# Patient Record
Sex: Male | Born: 1935 | Race: Black or African American | Hispanic: No | Marital: Married | Smoking: Former smoker
Health system: Southern US, Community
[De-identification: ages and names within clinical notes are randomized; demographics above are authoritative.]

## PROBLEM LIST (undated history)

## (undated) DIAGNOSIS — D696 Thrombocytopenia, unspecified: Secondary | ICD-10-CM

## (undated) DIAGNOSIS — E785 Hyperlipidemia, unspecified: Secondary | ICD-10-CM

## (undated) DIAGNOSIS — K279 Peptic ulcer, site unspecified, unspecified as acute or chronic, without hemorrhage or perforation: Secondary | ICD-10-CM

## (undated) DIAGNOSIS — K52832 Lymphocytic colitis: Secondary | ICD-10-CM

## (undated) DIAGNOSIS — I10 Essential (primary) hypertension: Secondary | ICD-10-CM

## (undated) DIAGNOSIS — K2960 Other gastritis without bleeding: Secondary | ICD-10-CM

## (undated) DIAGNOSIS — K219 Gastro-esophageal reflux disease without esophagitis: Secondary | ICD-10-CM

## (undated) DIAGNOSIS — S73004A Unspecified dislocation of right hip, initial encounter: Secondary | ICD-10-CM

## (undated) DIAGNOSIS — I219 Acute myocardial infarction, unspecified: Secondary | ICD-10-CM

## (undated) DIAGNOSIS — I2510 Atherosclerotic heart disease of native coronary artery without angina pectoris: Secondary | ICD-10-CM

## (undated) DIAGNOSIS — A0472 Enterocolitis due to Clostridium difficile, not specified as recurrent: Secondary | ICD-10-CM

## (undated) HISTORY — PX: TOTAL HIP ARTHROPLASTY: SHX124

## (undated) HISTORY — DX: Peptic ulcer, site unspecified, unspecified as acute or chronic, without hemorrhage or perforation: K27.9

## (undated) HISTORY — DX: Acute myocardial infarction, unspecified: I21.9

## (undated) HISTORY — PX: APPENDECTOMY: SHX54

## (undated) HISTORY — PX: TONSILLECTOMY: SUR1361

## (undated) HISTORY — PX: INGUINAL HERNIA REPAIR: SUR1180

## (undated) HISTORY — PX: FEMORAL HERNIA REPAIR: SUR1179

## (undated) HISTORY — DX: Unspecified dislocation of right hip, initial encounter: S73.004A

## (undated) HISTORY — DX: Lymphocytic colitis: K52.832

## (undated) HISTORY — PX: UPPER GASTROINTESTINAL ENDOSCOPY: SHX188

## (undated) HISTORY — PX: ARTHROPLASTY: SHX135

## (undated) HISTORY — DX: Enterocolitis due to Clostridium difficile, not specified as recurrent: A04.72

## (undated) HISTORY — DX: Other gastritis without bleeding: K29.60

## (undated) HISTORY — PX: COLONOSCOPY: SHX174

## (undated) HISTORY — PX: HEMORRHOID SURGERY: SHX153

## (undated) HISTORY — DX: Thrombocytopenia, unspecified: D69.6

## (undated) HISTORY — PX: CORONARY ANGIOPLASTY WITH STENT PLACEMENT: SHX49

---

## 2001-02-02 HISTORY — PX: PACEMAKER INSERTION: SHX728

## 2006-02-02 DIAGNOSIS — I219 Acute myocardial infarction, unspecified: Secondary | ICD-10-CM

## 2006-02-02 HISTORY — DX: Acute myocardial infarction, unspecified: I21.9

## 2008-09-17 ENCOUNTER — Emergency Department (HOSPITAL_BASED_OUTPATIENT_CLINIC_OR_DEPARTMENT_OTHER): Admission: EM | Admit: 2008-09-17 | Discharge: 2008-09-17 | Payer: Self-pay | Admitting: Emergency Medicine

## 2008-09-17 ENCOUNTER — Ambulatory Visit: Payer: Self-pay | Admitting: Diagnostic Radiology

## 2008-10-09 ENCOUNTER — Emergency Department (HOSPITAL_BASED_OUTPATIENT_CLINIC_OR_DEPARTMENT_OTHER): Admission: EM | Admit: 2008-10-09 | Discharge: 2008-10-09 | Payer: Self-pay | Admitting: Emergency Medicine

## 2009-01-02 ENCOUNTER — Ambulatory Visit: Payer: Self-pay | Admitting: Internal Medicine

## 2009-01-02 ENCOUNTER — Encounter (INDEPENDENT_AMBULATORY_CARE_PROVIDER_SITE_OTHER): Payer: Self-pay | Admitting: *Deleted

## 2009-01-03 ENCOUNTER — Encounter (INDEPENDENT_AMBULATORY_CARE_PROVIDER_SITE_OTHER): Payer: Self-pay | Admitting: *Deleted

## 2009-01-03 ENCOUNTER — Encounter: Payer: Self-pay | Admitting: Internal Medicine

## 2009-01-16 ENCOUNTER — Encounter (INDEPENDENT_AMBULATORY_CARE_PROVIDER_SITE_OTHER): Payer: Self-pay | Admitting: *Deleted

## 2009-01-24 ENCOUNTER — Telehealth: Payer: Self-pay | Admitting: Internal Medicine

## 2009-02-02 DIAGNOSIS — A0472 Enterocolitis due to Clostridium difficile, not specified as recurrent: Secondary | ICD-10-CM

## 2009-02-02 HISTORY — DX: Enterocolitis due to Clostridium difficile, not specified as recurrent: A04.72

## 2009-02-02 HISTORY — PX: TOTAL HIP REVISION: SHX763

## 2009-02-16 ENCOUNTER — Emergency Department (HOSPITAL_COMMUNITY): Admission: EM | Admit: 2009-02-16 | Discharge: 2009-02-16 | Payer: Self-pay | Admitting: Emergency Medicine

## 2009-02-18 ENCOUNTER — Encounter (INDEPENDENT_AMBULATORY_CARE_PROVIDER_SITE_OTHER): Payer: Self-pay | Admitting: *Deleted

## 2009-04-29 ENCOUNTER — Ambulatory Visit: Payer: Self-pay | Admitting: Diagnostic Radiology

## 2009-04-29 ENCOUNTER — Emergency Department (HOSPITAL_BASED_OUTPATIENT_CLINIC_OR_DEPARTMENT_OTHER): Admission: EM | Admit: 2009-04-29 | Discharge: 2009-04-29 | Payer: Self-pay | Admitting: Emergency Medicine

## 2009-05-09 ENCOUNTER — Telehealth: Payer: Self-pay | Admitting: Internal Medicine

## 2009-05-13 ENCOUNTER — Inpatient Hospital Stay (HOSPITAL_COMMUNITY): Admission: AD | Admit: 2009-05-13 | Discharge: 2009-05-16 | Payer: Self-pay | Admitting: Internal Medicine

## 2009-05-13 ENCOUNTER — Ambulatory Visit: Payer: Self-pay | Admitting: Gastroenterology

## 2009-05-13 DIAGNOSIS — A0472 Enterocolitis due to Clostridium difficile, not specified as recurrent: Secondary | ICD-10-CM | POA: Insufficient documentation

## 2009-05-13 DIAGNOSIS — I2510 Atherosclerotic heart disease of native coronary artery without angina pectoris: Secondary | ICD-10-CM | POA: Insufficient documentation

## 2009-05-13 DIAGNOSIS — E785 Hyperlipidemia, unspecified: Secondary | ICD-10-CM | POA: Insufficient documentation

## 2009-05-13 DIAGNOSIS — M1990 Unspecified osteoarthritis, unspecified site: Secondary | ICD-10-CM | POA: Insufficient documentation

## 2009-05-13 DIAGNOSIS — I10 Essential (primary) hypertension: Secondary | ICD-10-CM | POA: Insufficient documentation

## 2009-05-21 ENCOUNTER — Telehealth: Payer: Self-pay | Admitting: Internal Medicine

## 2009-05-21 ENCOUNTER — Ambulatory Visit: Payer: Self-pay | Admitting: Internal Medicine

## 2009-05-23 ENCOUNTER — Telehealth (INDEPENDENT_AMBULATORY_CARE_PROVIDER_SITE_OTHER): Payer: Self-pay

## 2009-05-27 ENCOUNTER — Encounter (INDEPENDENT_AMBULATORY_CARE_PROVIDER_SITE_OTHER): Payer: Self-pay

## 2009-05-28 ENCOUNTER — Ambulatory Visit: Payer: Self-pay | Admitting: Internal Medicine

## 2009-06-04 ENCOUNTER — Telehealth: Payer: Self-pay | Admitting: Internal Medicine

## 2009-06-04 ENCOUNTER — Inpatient Hospital Stay (HOSPITAL_COMMUNITY): Admission: AD | Admit: 2009-06-04 | Discharge: 2009-06-06 | Payer: Self-pay | Admitting: Internal Medicine

## 2009-06-04 ENCOUNTER — Ambulatory Visit: Payer: Self-pay | Admitting: Internal Medicine

## 2009-06-10 ENCOUNTER — Ambulatory Visit: Payer: Self-pay | Admitting: Gastroenterology

## 2009-06-10 ENCOUNTER — Telehealth: Payer: Self-pay | Admitting: Internal Medicine

## 2009-07-02 ENCOUNTER — Ambulatory Visit: Payer: Self-pay | Admitting: Internal Medicine

## 2010-02-15 ENCOUNTER — Emergency Department (HOSPITAL_BASED_OUTPATIENT_CLINIC_OR_DEPARTMENT_OTHER)
Admission: EM | Admit: 2010-02-15 | Discharge: 2010-02-15 | Payer: Self-pay | Source: Home / Self Care | Admitting: Emergency Medicine

## 2010-02-15 ENCOUNTER — Encounter (INDEPENDENT_AMBULATORY_CARE_PROVIDER_SITE_OTHER): Payer: Self-pay | Admitting: *Deleted

## 2010-02-17 ENCOUNTER — Telehealth: Payer: Self-pay | Admitting: Internal Medicine

## 2010-02-17 LAB — PROTIME-INR
INR: 1.03 (ref 0.00–1.49)
Prothrombin Time: 13.7 seconds (ref 11.6–15.2)

## 2010-02-17 LAB — COMPREHENSIVE METABOLIC PANEL
ALT: 56 U/L — ABNORMAL HIGH (ref 0–53)
AST: 54 U/L — ABNORMAL HIGH (ref 0–37)
Albumin: 4 g/dL (ref 3.5–5.2)
Alkaline Phosphatase: 112 U/L (ref 39–117)
BUN: 17 mg/dL (ref 6–23)
CO2: 24 mEq/L (ref 19–32)
Calcium: 8.9 mg/dL (ref 8.4–10.5)
Chloride: 102 mEq/L (ref 96–112)
Creatinine, Ser: 0.9 mg/dL (ref 0.4–1.5)
GFR calc Af Amer: >60 mL/min (ref >60)
GFR calc non Af Amer: >60 mL/min (ref >60)
Glucose, Bld: 91 mg/dL (ref 70–99)
Potassium: 3.6 mEq/L (ref 3.5–5.1)
Sodium: 140 mEq/L (ref 135–145)
Total Bilirubin: 0.9 mg/dL (ref 0.3–1.2)
Total Protein: 7.3 g/dL (ref 6.0–8.3)

## 2010-02-17 LAB — URINALYSIS, ROUTINE W REFLEX MICROSCOPIC
Hgb urine dipstick: NEGATIVE
Ketones, ur: 40 mg/dL — AB
Leukocytes, UA: NEGATIVE
Nitrite: NEGATIVE
Protein, ur: 30 mg/dL — AB
Specific Gravity, Urine: 1.036 — ABNORMAL HIGH (ref 1.005–1.030)
Urine Glucose, Fasting: NEGATIVE mg/dL
Urobilinogen, UA: 0.2 mg/dL (ref 0.0–1.0)
pH: 6 (ref 5.0–8.0)

## 2010-02-17 LAB — URINE MICROSCOPIC-ADD ON

## 2010-02-17 LAB — DIFFERENTIAL
Basophils Absolute: 0 10*3/uL (ref 0.0–0.1)
Basophils Relative: 0 % (ref 0–1)
Eosinophils Absolute: 0.1 10*3/uL (ref 0.0–0.7)
Eosinophils Relative: 1 % (ref 0–5)
Lymphocytes Relative: 11 % — ABNORMAL LOW (ref 12–46)
Lymphs Abs: 1 10*3/uL (ref 0.7–4.0)
Monocytes Absolute: 0.7 10*3/uL (ref 0.1–1.0)
Monocytes Relative: 7 % (ref 3–12)
Neutro Abs: 7.8 10*3/uL — ABNORMAL HIGH (ref 1.7–7.7)
Neutrophils Relative %: 81 % — ABNORMAL HIGH (ref 43–77)

## 2010-02-17 LAB — CLOSTRIDIUM DIFFICILE BY PCR: Toxigenic C. Difficile by PCR: NEGATIVE

## 2010-02-17 LAB — CBC
HCT: 40.2 % (ref 39.0–52.0)
Hemoglobin: 13.8 g/dL (ref 13.0–17.0)
MCH: 27.8 pg (ref 26.0–34.0)
MCHC: 34.3 g/dL (ref 30.0–36.0)
MCV: 80.9 fL (ref 78.0–100.0)
Platelets: 177 10*3/uL (ref 150–400)
RBC: 4.97 MIL/uL (ref 4.22–5.81)
RDW: 15.4 % (ref 11.5–15.5)
WBC: 9.6 10*3/uL (ref 4.0–10.5)

## 2010-02-17 LAB — LIPASE, BLOOD: Lipase: 104 U/L (ref 23–300)

## 2010-02-17 LAB — LACTIC ACID, PLASMA: Lactic Acid, Venous: 1.2 mmol/L (ref 0.5–2.2)

## 2010-02-19 LAB — STOOL CULTURE

## 2010-02-19 LAB — OVA AND PARASITE EXAMINATION

## 2010-02-21 ENCOUNTER — Encounter: Payer: Self-pay | Admitting: Internal Medicine

## 2010-02-21 ENCOUNTER — Ambulatory Visit
Admission: RE | Admit: 2010-02-21 | Discharge: 2010-02-21 | Payer: Self-pay | Source: Home / Self Care | Attending: Internal Medicine | Admitting: Internal Medicine

## 2010-02-24 ENCOUNTER — Other Ambulatory Visit: Payer: Self-pay | Admitting: Internal Medicine

## 2010-02-24 ENCOUNTER — Ambulatory Visit
Admission: RE | Admit: 2010-02-24 | Discharge: 2010-02-24 | Payer: Self-pay | Source: Home / Self Care | Attending: Internal Medicine | Admitting: Internal Medicine

## 2010-02-27 ENCOUNTER — Telehealth: Payer: Self-pay | Admitting: Internal Medicine

## 2010-02-28 ENCOUNTER — Other Ambulatory Visit: Payer: Self-pay | Admitting: Internal Medicine

## 2010-02-28 ENCOUNTER — Ambulatory Visit
Admission: RE | Admit: 2010-02-28 | Discharge: 2010-02-28 | Payer: Self-pay | Source: Home / Self Care | Attending: Internal Medicine | Admitting: Internal Medicine

## 2010-02-28 ENCOUNTER — Encounter: Payer: Self-pay | Admitting: Internal Medicine

## 2010-02-28 DIAGNOSIS — K2940 Chronic atrophic gastritis without bleeding: Secondary | ICD-10-CM

## 2010-03-04 ENCOUNTER — Telehealth: Payer: Self-pay | Admitting: Internal Medicine

## 2010-03-04 NOTE — Progress Notes (Signed)
Summary: TRIAGE:Diarrhea  Phone Note Call from Patient Call back at Home Phone [phone]   Call For: Dr Leone Payor Summary of Call: Diarrhea came back this morning. Unable to even keep water down. Initial call taken by: Leanor Kail [facility],  Jun 04, 2009 11:06 AM  Follow-up for Phone Call        Patient  reports diarrhea returned yesterday and he also started vomiting yesterday, unable to hold anything down.  Tried water this am and not able to tolerate.  He is scheduled for a colon for Dr Leone Payor on Thursday.  Hospitalized last month for c-diff and vomiting.  Patient  will come in today and see Mike Gip PA at  2:30 Iva Boop MD, [facility]  Jun 04, 2009 11:37 AM  Follow-up by: Darcey Nora RN, CGRN,  Jun 04, 2009 11:24 AM

## 2010-03-04 NOTE — Progress Notes (Signed)
Summary: Symptom update  Phone Note Outgoing Call Call back at [facility] Phone [phone]   Call placed by: Darcey Nora RN, CGRN,  May 23, 2009 11:16 AM Call placed to: Patient Summary of Call: Pt called back with an update on his symptoms.  Stool is now getting semisolid.  Only one stool today, yesterday 3 watery stools.    Patient  is c/o nausea.  He is staying on a bland diet.  Denies fever.   Initial call taken by: Darcey Nora RN, CGRN,  May 23, 2009 11:19 AM  Follow-up for Phone Call        nausea likely from [facility] sounds like he is improving so continue with same plan he should be set up for colonoscopy in May - needs to hold Plavix - see prior documentation Follow-up by: Iva Boop MD, [facility],  May 23, 2009 1:56 PM  Additional Follow-up for Phone Call Additional follow up Details #1::        Left message for patient to call back Darcey Nora RN, [facility]  May 23, 2009 2:11 PM    Additional Follow-up for Phone Call Additional follow up Details #2::    Colon scheduled for 06/06/09 11:30, pre-visit 05/28/09 4:00 Darcey Nora RN, [facility]  May 27, 2009 8:33 AM

## 2010-03-04 NOTE — Assessment & Plan Note (Signed)
Summary: hosp. f/u per paula--ch.   History of Present Illness Visit Type: Follow-up Visit Primary GI MD: Stan Head MD [facility] Primary Provider: Tracey Harries, MD Requesting Provider: na Chief Complaint: Bowel movements immediately after meals History of Present Illness:   VERY NICE  75 YO MALE KNOWN TO DR. Leone Payor WHO HAS BEEN UNDERGOING TREATMENT FOR  C.DIFF. HE WAS HOSPITALIZED BRIEFLY SEVERAL WEEKS AGO,WAS TREATED WITH A 2 WEEK COURSE OF FLAGYL AND FLORASTOR WITH RESOLUTION OF HIS SXS. UNFORTUNATELY HE RELAPSED. HE WAS HOSPITALIZED AGAIN 5/3-5/5,BUT WAS SENT HOME ON FLAGYL AGAIN. HE DID NOT RESPOND AND WAS SEEN 5/9 AND STARTED ON VANCOMYCIN AND FLORASTOR. HE IS TAKING A TAPERED COURSE OVER 4 WEEKS.   HE IS FEELING GOOD, HAS GAINED 8 POUNDS. HE DENIES ANY ABDOMINAL PAIN, CRAMPING. STOOLS ARE FORMED,HAVING 4-5 BM'S /DAY.HE WILL START HIS LAST WEEK OF VANCO AT 250MG  ONCE DAILY HIS WEEK.   GI Review of Systems      Denies abdominal pain, acid reflux, belching, bloating, chest pain, dysphagia with liquids, dysphagia with solids, heartburn, loss of appetite, nausea, vomiting, vomiting blood, weight loss, and  weight gain.        Denies anal fissure, black tarry stools, change in bowel habit, constipation, diarrhea, diverticulosis, fecal incontinence, heme positive stool, hemorrhoids, irritable bowel syndrome, jaundice, light color stool, liver problems, rectal bleeding, and  rectal pain.    Current Medications (verified): 1)  Plavix 75 Mg Tabs (Clopidogrel Bisulfate) .... Take 1 Tablet By Mouth Once A Day (Holding) 2)  Lipitor 40 Mg Tabs (Atorvastatin Calcium) .... Take 1 Tablet By Mouth Once A Day 3)  Lisinopril 2.5 Mg Tabs (Lisinopril) .... Take 1 Tablet By Mouth Once A Day 4)  Metoprolol Tartrate 50 Mg Tabs (Metoprolol Tartrate) .... Take 1 Tablet By Mouth Two Times A Day 5)  Prevacid 30 Mg Cpdr (Lansoprazole) .... Take 1 Capsule By Mouth As Needed 6)  Vitamin B-12 100 Mcg Tabs  (Cyanocobalamin) .... Once Daily 7)  Aspirin 81 Mg Tbec (Aspirin) .... Take 1 Tablet By Mouth Once A Day 8)  Multivitamins  Tabs (Multiple Vitamin) .... Take 1 Tablet By Mouth Once A Day 9)  Afrin Nasal Spray 0.05 % Soln (Oxymetazoline Hcl) .... Once Daily 10)  Aleve 220 Mg Caps (Naproxen Sodium) .... Take One By Mouth As Needed 11)  Vancomycin 250 Mg/ml .... Take 1 Tsp 3 Times Daily On 5-9 Take 1 Tsp 4 Times Daily X 13 Days, Take 1 Tsp 2 Times Daily X 7 Days Take 1 Tsp 1 Time Daily X 7 Days  Allergies (verified): 1)  ! Allyson Sabal Flavors 2)  ! Niacin  Past History:  Past Medical History: Reviewed history from 06/10/2009 and no changes required. Coronary Artery Disease PTCA and DES x 2 (2008, 2009),s/p pacemaker 2003 GERD C.DIFF COLITIS 4/11 /RELAPSED 5/11. Hyperlipidemia Hypertension Myocardial Infarction Peptic Ulcer Disease-remote12-15 years ago Dislocated Rt. Hip  Past Surgical History: Reviewed history from 05/13/2009 and no changes required. Hernia Surgery-bilateral inguinal Hip Replacement-Bilateral PTCA-Stent Tonsillectomy pacemaker placed 2003  Family History: Reviewed history from 05/13/2009 and no changes required. Family History of Heart Disease: Mother, Father, Sisters, Brothers Family History of Diabetes: Sister and brother No FH of Colon Cancer: Family History of Breast Cancer:sister  Social History: Reviewed history from 05/13/2009 and no changes required. Married,wife with dementia Retired Patient is a former smoker. Quit in 1970. Alcohol Use - no Illicit Drug Use - no Patient gets regular exercise. Walking 3 x week  Review of  Systems       The patient complains of anxiety-new, arthritis/joint pain, and skin rash.  The patient denies allergy/sinus, anemia, back pain, blood in urine, breast changes/lumps, change in vision, confusion, cough, coughing up blood, depression-new, fainting, fatigue, fever, headaches-new, hearing problems, heart murmur, heart  rhythm changes, itching, menstrual pain, muscle pains/cramps, night sweats, nosebleeds, pregnancy symptoms, shortness of breath, sleeping problems, sore throat, swelling of feet/legs, swollen lymph glands, thirst - excessive , urination - excessive , urination changes/pain, urine leakage, vision changes, and voice change.         ROS OTHERWISE AS IN HPI  Vital Signs:  Patient profile:   75 year old male Height:      68 inches Weight:      180.50 pounds BMI:     27.54 Pulse rate:   68 / minute Pulse rhythm:   regular BP sitting:   112 / 74  (left arm) Cuff size:   regular  Vitals Entered By: June McMurray CMA Duncan Dull) (Jul 02, 2009 8:55 AM)  Physical Exam  General:  Well developed, well nourished, no acute distress. Head:  Normocephalic and atraumatic. Eyes:  PERRLA, no icterus. Lungs:  Clear throughout to auscultation. Heart:  Regular rate and rhythm; no murmurs, rubs,  or bruits. Abdomen:  SOFT, NONTENDER, NO MASS OR HSM,BS+ Rectal:  NOT DONE Extremities:  No clubbing, cyanosis, edema or deformities noted. Neurologic:  Alert and  oriented x4;  grossly normal neurologically. Psych:  Alert and cooperative. Normal mood and affect.   Impression & Recommendations:  Problem # 1:  CLOSTRIDIUM DIFFICILE COLITIS (ICD-008.45) 75 YO MALE WITH RELAPED C.DIFF COLITIS-IMPROVED ON VANCOMYCIN-CURRENTLY FINISHING A 4 WEEK  TAPERED COURSE.   FINISH 4TH WEEK OF VANCOMYCIN 250MG  DAILY TAKE 2 MORE WEEKS OF FLORASTOR TWICE DAILY  ROV WITH DR. Leone Payor AS NEEDED. PT ADVISED TO CALL FOR ANY RECURRENCE OF SXS.  Patient Instructions: 1)  Take 1 tab of Florastor twice daily until finished. 2)  Samples given. 3)  Finish the Vancomycin.  4)  Copy sent to : Dr. Everlene Other, MD 5)  The medication list was reviewed and reconciled.  All changed / newly prescribed medications were explained.  A complete medication list was provided to the patient / caregiver.

## 2010-03-04 NOTE — Assessment & Plan Note (Signed)
Summary: DIARRHEA/SP   History of Present Illness Visit Type: Follow-up Visit Primary GI MD: Stan Head MD [facility] Primary Provider: Tracey Harries, MD Requesting Provider: Tracey Harries, MD Chief Complaint: diarrhea x 5 weeks History of Present Illness:   F/U of C. diff diarrhea, was admitted last week and diagnosed by stool study, treated with cholestyramine and metronidazole.Keeping liquids down but runny diarrhea remains a problem. No significant abdomoinal pain. Some difficulty swallowing large pills Grits going down ok He has had 3 watery stools and dark green in last 24 hours. Less frequent than when hospitalized. 90% better Thinks cholestyramine makes him defecate.            Current Medications (verified): 1)  Plavix 75 Mg Tabs (Clopidogrel Bisulfate) .... Take 1 Tablet By Mouth Once A Day 2)  Lipitor 40 Mg Tabs (Atorvastatin Calcium) .... Take 1 Tablet By Mouth Once A Day 3)  Lisinopril 2.5 Mg Tabs (Lisinopril) .... Take 1 Tablet By Mouth Once A Day 4)  Metoprolol Tartrate 50 Mg Tabs (Metoprolol Tartrate) .... Take 1 Tablet By Mouth Two Times A Day 5)  Citracal/vitamin D 250-200 Mg-Unit Tabs (Calcium Citrate-Vitamin D) .... Once Daily 6)  Prevacid 30 Mg Cpdr (Lansoprazole) .... Take 1 Capsule By Mouth As Needed 7)  Vitamin B-12 100 Mcg Tabs (Cyanocobalamin) .... Once Daily 8)  Aspirin 81 Mg Tbec (Aspirin) .... Take 1 Tablet By Mouth Once A Day 9)  Multivitamins  Tabs (Multiple Vitamin) .... Take 1 Tablet By Mouth Once A Day 10)  Afrin Nasal Spray 0.05 % Soln (Oxymetazoline Hcl) .... Once Daily 11)  Tylenol Extra Strength 500 Mg Tabs (Acetaminophen) .... Two Tablets By Mouth Two Times A Day 12)  Metronidazole 500 Mg  Tabs (Metronidazole) .... Take 1 By Mouth Three Times A Day 13)  Cholestyramine   Powd (Cholestyramine) .Marland Kitchen.. 1 Packet Three Times A Day 14)  Florastor 250 Mg Caps (Saccharomyces Boulardii) .Marland Kitchen.. 1 By Mouth Two Times A Day  Allergies (verified): 1)  ! Allyson Sabal  Flavors 2)  ! Niacin  Past History:  Past Medical History: Reviewed history from 05/13/2009 and no changes required. Coronary Artery Disease PTCA and DES x 2 (2008, 2009),s/p pacemaker 2003 GERD Hyperlipidemia Hypertension Myocardial Infarction Peptic Ulcer Disease-remote12-15 years ago Dislocated Rt. Hip  Past Surgical History: Reviewed history from 05/13/2009 and no changes required. Hernia Surgery-bilateral inguinal Hip Replacement-Bilateral PTCA-Stent Tonsillectomy pacemaker placed 2003  Family History: Reviewed history from 05/13/2009 and no changes required. Family History of Heart Disease: Mother, Father, Sisters, Brothers Family History of Diabetes: Sister and brother No FH of Colon Cancer: Family History of Breast Cancer:sister  Social History: Reviewed history from 05/13/2009 and no changes required. Married,wife with dementia Retired Patient is a former smoker. Quit in 1970. Alcohol Use - no Illicit Drug Use - no Patient gets regular exercise. Walking 3 x week  Review of Systems       The patient complains of arthritis/joint pain, fatigue, heart rhythm changes, night sweats, shortness of breath, and thirst - excessive.         All other ROS negative except as per HPI.   Vital Signs:  Patient profile:   75 year old male Height:      68 inches Weight:      180.13 pounds BMI:     27.49 Pulse rate:   80 / minute Pulse rhythm:   regular BP sitting:   90 / 56  (right arm) Cuff size:   regular  Vitals  Entered By: June McMurray CMA Duncan Dull) (May 21, 2009 3:39 PM)  Physical Exam  General:  Well developed, well nourished, no acute distress. Lungs:  Clear throughout to auscultation. Heart:  Regular rate and rhythm; no murmurs, rubs,  or bruits. Abdomen:  soft, bs+, nontender, no mass or hsm   Impression & Recommendations:  Problem # 1:  CLOSTRIDIUM DIFFICILE COLITIS (ICD-008.45) Assessment Improved he is better but still symptmatic it is  possible cholestyramine is binding meds will stop that and recheck in 2 days by phone may need longer metronidazole vs. vancomycin force fluids  Patient Instructions: 1)  Please stop the cholestyramine. 2)  Please continue to drink as many fluids as you can and eat food as tolerated. 3)  We will call you for a follow up on Thursday.  If you have not heard from [place] by 11am, then call our office. 4)  Copy sent to : Tracey Harries, MD 5)  The medication list was reviewed and reconciled.  All changed / newly prescribed medications were explained.  A complete medication list was provided to the patient / caregiver.

## 2010-03-04 NOTE — Letter (Signed)
Summary: [facility] Instructions  [facility]  [address] [place], [place] [address]   Phone: [phone]  Fax: [phone]       Jose Ibarra    06-10-1969    MRN: [id number]        Procedure Day /Date: Thursday 06/06/2009     Arrival Time: 10:30 am     Procedure Time: 11:30 am     Location of Procedure:                    _ x_   Endoscopy Center (4th Floor)                        PREPARATION FOR COLONOSCOPY WITH MOVIPREP   Starting 5 days prior to your procedure Saturday 4/30 do not eat nuts, seeds, popcorn, corn, beans, peas,  salads, or any raw vegetables.  Do not take any fiber supplements (e.g. Metamucil, Citrucel, and Benefiber).  THE DAY BEFORE YOUR PROCEDURE         DATE: Wednesday 5/4  1.  Drink clear liquids the entire day-NO SOLID FOOD  2.  Do not drink anything colored red or purple.  Avoid juices with pulp.  No orange juice.  3.  Drink at least 64 oz. (8 glasses) of fluid/clear liquids during the day to prevent dehydration and help the prep work efficiently.  CLEAR LIQUIDS INCLUDE: Water Jello Ice Popsicles Tea (sugar ok, no milk/cream) Powdered fruit flavored drinks Coffee (sugar ok, no milk/cream) Gatorade Juice: apple, white grape, white cranberry  Lemonade Clear bullion, consomm, broth Carbonated beverages (any kind) Strained chicken noodle soup Hard Candy                             4.  In the morning, mix first dose of MoviPrep solution:    Empty 1 Pouch A and 1 Pouch B into the disposable container    Add lukewarm drinking water to the top line of the container. Mix to dissolve    Refrigerate (mixed solution should be used within 24 hrs)  5.  Begin drinking the prep at 5:00 p.m. The MoviPrep container is divided by 4 marks.   Every 15 minutes drink the solution down to the next mark (approximately 8 oz) until the full liter is complete.   6.  Follow completed prep with 16 oz of clear liquid of your choice (Nothing  red or purple).  Continue to drink clear liquids until bedtime.  7.  Before going to bed, mix second dose of MoviPrep solution:    Empty 1 Pouch A and 1 Pouch B into the disposable container    Add lukewarm drinking water to the top line of the container. Mix to dissolve    Refrigerate  THE DAY OF YOUR PROCEDURE      DATE: Thursday 5/5  Beginning at 6:30 a.m. (5 hours before procedure):         1. Every 15 minutes, drink the solution down to the next mark (approx 8 oz) until the full liter is complete.  2. Follow completed prep with 16 oz. of clear liquid of your choice.    3. You may drink clear liquids until 9:30 am (2 HOURS BEFORE PROCEDURE).   MEDICATION INSTRUCTIONS  Unless otherwise instructed, you should take regular prescription medications with a small sip of water   as early as possible the morning of your procedure.  Stop taking Plavix or Aggrenox on  Pt was informed to stop his Plavix on 05-30-09!               OTHER INSTRUCTIONS  You will need a responsible adult at least 73 years of age to accompany you and drive you home.   This person must remain in the waiting room during your procedure.  Wear loose fitting clothing that is easily removed.  Leave jewelry and other valuables at home.  However, you may wish to bring a book to read or  an iPod/MP3 player to listen to music as you wait for your procedure to start.  Remove all body piercing jewelry and leave at home.  Total time from sign-in until discharge is approximately 2-3 hours.  You should go home directly after your procedure and rest.  You can resume normal activities the  day after your procedure.  The day of your procedure you should not:   Drive   Make legal decisions   Operate machinery   Drink alcohol   Return to work  You will receive specific instructions about eating, activities and medications before you leave.    The above instructions have been reviewed and explained  to me by   Ulis Rias RN  May 28, 2009 4:25 PM    I fully understand and can verbalize these instructions _____________________________ Date _________

## 2010-03-04 NOTE — Assessment & Plan Note (Signed)
Summary: diarrhea/sheri   History of Present Illness Visit Type: Follow-up Visit Primary GI MD: Stan Head MD [facility] Primary Provider: Tracey Harries, MD Requesting Provider: na Chief Complaint: Follow up from hospital, patient complains that diarrhea started back yesterday. He states that he had 10 BM yesterday. He denies any blood in his stool.  History of Present Illness:   75 YO MALE WHO WAS JUST HOSPITALIZED 5/3-5/5 FOR RELAPSED C.DIFF. HE WAS INITIALLY TREATED WITH FLAGYL, HAD A POSITIVE STOOL TOXIN AT THAT TIME. HE RESPONDED TO THE FLAGYL BUT DIARRHEA RECURRED AFTER HE FINISHED THE COURSE. HE WAS READMITTED 5/3 WITH MULTIPLE WATERY STOOLS, WEAKNESS, AND DEHYDRATION. HE WAS STARTED ON VANCOMYCIN ORALLY AND FLORASTOR. HIS STOOL FOR C.DIFF WAS NEGATIVE. HE WAS DISCHARGED HOME ON FLAGYL, AND FLORASTOR. HE COMES IN TODAY, 3 DAYS LATER WITH RECURRENT PROFUSE DIARRHEA YESTERDAY. HE REPORTS AT LEAST 10 BMS OF LIQUID FOUL SMELLING STOOL. NO PAIN, NO N/V. NO BLEEDING.   GI Review of Systems    Reports loss of appetite and  weight loss.   Weight loss of 4 pounds over 2 days.   Denies abdominal pain, acid reflux, belching, bloating, chest pain, dysphagia with liquids, dysphagia with solids, heartburn, nausea, vomiting, and  vomiting blood.      Reports change in bowel habits and  diarrhea.     Denies anal fissure, black tarry stools, constipation, diverticulosis, fecal incontinence, heme positive stool, hemorrhoids, irritable bowel syndrome, jaundice, light color stool, liver problems, rectal bleeding, and  rectal pain.   Current Medications (verified): 1)  Plavix 75 Mg Tabs (Clopidogrel Bisulfate) .... Take 1 Tablet By Mouth Once A Day (Holding) 2)  Lipitor 40 Mg Tabs (Atorvastatin Calcium) .... Take 1 Tablet By Mouth Once A Day 3)  Lisinopril 2.5 Mg Tabs (Lisinopril) .... Take 1 Tablet By Mouth Once A Day 4)  Metoprolol Tartrate 50 Mg Tabs (Metoprolol Tartrate) .... Take 1 Tablet By Mouth Two  Times A Day 5)  Prevacid 30 Mg Cpdr (Lansoprazole) .... Take 1 Capsule By Mouth As Needed 6)  Vitamin B-12 100 Mcg Tabs (Cyanocobalamin) .... Once Daily 7)  Aspirin 81 Mg Tbec (Aspirin) .... Take 1 Tablet By Mouth Once A Day 8)  Multivitamins  Tabs (Multiple Vitamin) .... Take 1 Tablet By Mouth Once A Day 9)  Afrin Nasal Spray 0.05 % Soln (Oxymetazoline Hcl) .... Once Daily 10)  Aleve 220 Mg Caps (Naproxen Sodium) .... Take One By Mouth As Needed 11)  Florastor 250 Mg Caps (Saccharomyces Boulardii) .... Take One By Mouth Two Times A Day 12)  Metronidazole 250 Mg Tabs (Metronidazole) .... Take One By Mouth Three Times A Day  Allergies (verified): 1)  ! Allyson Sabal Flavors 2)  ! Niacin  Past History:  Past Surgical History: Last updated: 05/13/2009 Hernia Surgery-bilateral inguinal Hip Replacement-Bilateral PTCA-Stent Tonsillectomy pacemaker placed 2003  Family History: Last updated: 05/13/2009 Family History of Heart Disease: Mother, Father, Sisters, Brothers Family History of Diabetes: Sister and brother No FH of Colon Cancer: Family History of Breast Cancer:sister  Social History: Last updated: 05/13/2009 Married,wife with dementia Retired Patient is a former smoker. Quit in 1970. Alcohol Use - no Illicit Drug Use - no Patient gets regular exercise. Walking 3 x week  Past Medical History: Coronary Artery Disease PTCA and DES x 2 (2008, 2009),s/p pacemaker 2003 GERD C.DIFF COLITIS 4/11 /RELAPSED 5/11. Hyperlipidemia Hypertension Myocardial Infarction Peptic Ulcer Disease-remote12-15 years ago Dislocated Rt. Hip  Review of Systems       The patient  complains of arthritis/joint pain.  The patient denies allergy/sinus, anemia, anxiety-new, back pain, blood in urine, breast changes/lumps, change in vision, confusion, cough, coughing up blood, depression-new, fainting, fatigue, fever, headaches-new, hearing problems, heart murmur, heart rhythm changes, itching, menstrual  pain, muscle pains/cramps, night sweats, nosebleeds, pregnancy symptoms, shortness of breath, skin rash, sleeping problems, sore throat, swelling of feet/legs, swollen lymph glands, thirst - excessive, urination - excessive, urination changes/pain, urine leakage, vision changes, and voice change.         ROS OTHERWISE AS IN HPI  Vital Signs:  Patient profile:   75 year old male Height:      68 inches Weight:      173.4 pounds BMI:     26.46 Temp:     98.6 degrees F oral Pulse rate:   84 / minute Pulse rhythm:   regular BP sitting:   104 / 62  (left arm) Cuff size:   regular  Vitals Entered By: Harlow Mares CMA Duncan Dull) (Jun 10, 2009 1:40 PM)  Physical Exam  General:  Well developed, well nourished, no acute distress. Head:  Normocephalic and atraumatic. Eyes:  PERRLA, no icterus. Lungs:  Clear throughout to auscultation. Heart:  Regular rate and rhythm; no murmurs, rubs,  or bruits. Abdomen:  SOFT, NO FOCAL TENDERNESS, NO MASS OR HSM,BS+ Rectal:  NOT DONE Extremities:  No clubbing, cyanosis, edema or deformities noted. Neurologic:  Alert and  oriented x4;  grossly normal neurologically. Psych:  Alert and cooperative. Normal mood and affect.  Impression & Recommendations:  Problem # 1:  CLOSTRIDIUM DIFFICILE COLITIS (ICD-008.45) Assessment Deteriorated C.DIFF, RELAPSED AFTER FLAGYL AND REQUIRED REPEAT HOSPITALIZATION -TREATED WITH VANCO  BRIEFLY, IMPROVED , THEN DISCHARGED ON FLAGYL. NOW  WITH RECURRENT WATERY DIARRHEA-COURSE IS CONSISTENT WITH C.DIFF COLITIS REFRACTORY TO FLAGYL. PUSH FLUIDS, SOFT, LACTOSE FREE DIET STOP FLAGYL AND BEGIN VANCOMYCIN AS OUTLINED BELOW CONTINUE FLORASTOR , ONE TWICE DAILY X 3 MORE WEEKS KEEP FOLLOW UP APPT IN 3 WEEKS. PT ADVISED TO CALL IN THE INTERIM FOR ANY WORSENING OF SXS, OR FAILURE TO IMPROVE OVER THE NEXT FEW DAYS.  Problem # 2:  CORONARY ARTERY DISEASE (ICD-414.00) Assessment: Comment Only ON PLAVIX  Problem # 3:  OSTEOARTHRITIS  (ICD-715.9) Assessment: Comment Only  Problem # 4:  HYPERTENSION (ICD-401.9) Assessment: Comment Only  Patient Instructions: 1)  Go to [facility] today and pick up 3 tablets of Vancomycin. 2)  Take 2 tablets today and 1 tab tomorrow morning. 3)  Go to [facility] tomorrow Tuesday 06-11-09 and pick up the Vancomycin liquid.   4)  Take 1 tsp 3 times tomorrow. 5)  Take 1 tsp 4 times daily x 13 days. 6)  Take 1 tsp 2 times daily x 7 days. 7)  Take 1 tsp 1 time daily x 7 days. 8)  The medication list was reviewed and reconciled.  All changed / newly prescribed medications were explained.  A complete medication list was provided to the patient / caregiver. 9)  Copy sent to : Tracey Harries, MD  Prescriptions: VANCOCIN HCL 250 MG CAPS (VANCOMYCIN HCL) Take 2 tab today  5-9 and 1 tab  tomorrow 5-10.  #3 x 0   Entered by:   Lowry Ram NCMA   Authorized by:   Sammuel Cooper PA-c   Signed by:   Lowry Ram NCMA on 06/10/2009   Method used:   Telephoned to ...       [facility]* (retail)  [address]       [place], [place]  [id number]       Ph: [phone]       Fax: [phone]   RxID:   [id number]

## 2010-03-04 NOTE — Initial Assessments (Signed)
Summary: nausea/vomiting/ sheri   History of Present Illness Visit Type: Follow-up Visit Primary GI MD: Stan Head MD [facility] Primary Provider: Tracey Harries, MD Requesting Provider: Tracey Harries, MD Chief Complaint: nausea, vomiting, and diarrhea since March History of Present Illness:   75 YO MALE KNOWN TO DR. Leone Payor, INITIALLY SEEN IN 12/10 AND WAS TO SCHEDULE COLONOSCOPY FOR ROUTINE FOLLOW UP. HE HAS BEEN UNABLE TO PROCEED DUE TO SEVERAL OTHER HEALTH ISSUES. HE HAD A HIP DISPLACEMENT IN FEBRUARY, THEN A HOSPITALIZATION FOR CHEST PAIN AND LEFT SHOULDER PAIN AT [facility]-DETERMINED TO BE MUSCULOSKELETAL PAIN. HE COMES HERE WITH C/O 3 WEEK HX OF NAUSEA, VOMITING, AND DIARRHEA WHICH HAS BEEN PROGRESSIVE. HE REQUIRED FLUIDS AT DR. Bonney Leitz OFFICE LAST WEEK. NOW OVER THE PAST 3 DAYS, UNABLE TO KEEP ANYTHING DOWN, INCLUDING LIQUIDS. HE IS HAVING WATERY, NONBLOODY DIARRHEA 4-5 X/DAY. NO FEVER, CHILLS, NO C/O ABDOMINAL PAIN, CRAMPING. HE FEELS WEAK. NO RECENT ABX, UNSURE ABOUT NSAIDS AROUND THE TIME OF HIP SURGERY.   GI Review of Systems    Reports loss of appetite, nausea, vomiting, and  weight loss.      Denies abdominal pain, acid reflux, belching, bloating, chest pain, dysphagia with liquids, dysphagia with solids, heartburn, and  vomiting blood.      Reports change in bowel habits and  diarrhea.     Denies anal fissure, black tarry stools, diverticulosis, fecal incontinence, heme positive stool, hemorrhoids, irritable bowel syndrome, jaundice, light color stool, liver problems, rectal bleeding, and  rectal pain. Dyspepsia History:      There is a prior history of GERD.     Current Medications (verified): 1)  Plavix 75 Mg Tabs (Clopidogrel Bisulfate) .... Take 1 Tablet By Mouth Once A Day 2)  Lipitor 40 Mg Tabs (Atorvastatin Calcium) .... Take 1 Tablet By Mouth Once A Day 3)  Lisinopril 2.5 Mg Tabs (Lisinopril) .... Take 1 Tablet By Mouth Once A Day 4)  Metoprolol Tartrate 50 Mg  Tabs (Metoprolol Tartrate) .... Take 1 Tablet By Mouth Two Times A Day 5)  Citracal/vitamin D 250-200 Mg-Unit Tabs (Calcium Citrate-Vitamin D) .... Once Daily 6)  Prevacid 30 Mg Cpdr (Lansoprazole) .... Take 1 Capsule By Mouth As Needed 7)  Vitamin B-12 100 Mcg Tabs (Cyanocobalamin) .... Once Daily 8)  Aspirin 81 Mg Tbec (Aspirin) .... Take 1 Tablet By Mouth Once A Day 9)  Multivitamins  Tabs (Multiple Vitamin) .... Take 1 Tablet By Mouth Once A Day 10)  Afrin Nasal Spray 0.05 % Soln (Oxymetazoline Hcl) .... Once Daily 11)  Tylenol Extra Strength 500 Mg Tabs (Acetaminophen) .... Two Tablets By Mouth Two Times A Day  Allergies (verified): 1)  ! Allyson Sabal Flavors 2)  ! Niacin  Past History:  Past Medical History: Coronary Artery Disease PTCA and DES x 2 (2008, 2009),s/p pacemaker 2003 GERD Hyperlipidemia Hypertension Myocardial Infarction Peptic Ulcer Disease-remote12-15 years ago Dislocated Rt. Hip  Past Surgical History: Hernia Surgery-bilateral inguinal Hip Replacement-Bilateral PTCA-Stent Tonsillectomy pacemaker placed 2003  Family History: Reviewed history from 01/02/2009 and no changes required. Family History of Heart Disease: Mother, Father, Sisters, Brothers Family History of Diabetes: Sister and brother No FH of Colon Cancer: Family History of Breast Cancer:sister  Social History: Reviewed history from 01/01/2009 and no changes required. Married,wife with dementia Retired Patient is a former smoker. Quit in 1970. Alcohol Use - no Illicit Drug Use - no Patient gets regular exercise. Walking 3 x week  Review of Systems       The  patient complains of arthritis/joint pain, sleeping problems, and swelling of feet/legs.  The patient denies allergy/sinus, anemia, anxiety-new, back pain, blood in urine, breast changes/lumps, change in vision, confusion, cough, coughing up blood, depression-new, fainting, fatigue, fever, headaches-new, hearing problems, heart murmur,  heart rhythm changes, itching, muscle pains/cramps, night sweats, nosebleeds, shortness of breath, skin rash, sore throat, swollen lymph glands, thirst - excessive, urination - excessive, urination changes/pain, urine leakage, vision changes, and voice change.         ros otherwise as in hpi  Vital Signs:  Patient profile:   75 year old male Height:      68 inches Weight:      184 pounds BMI:     28.08 Pulse rate:   100 / minute Pulse rhythm:   regular BP sitting:   118 / 66  (left arm)  Vitals Entered By: [name] NCMA (May 13, 2009 9:12 AM)  Physical Exam  General:  Well developed, well nourished, no acute distress. Head:  Normocephalic and atraumatic. Eyes:  PERRLA, no icterus. Mouth:  buccal mucosa dry. Lungs:  Clear throughout to auscultation. Heart:  Regular rate and rhythm; no murmurs, rubs,  or bruits. Abdomen:  soft, bs+, nontender, no mass or hsm Rectal:  not done Extremities:  No clubbing, cyanosis, edema or deformities noted. Neurologic:  Alert and  oriented x4;  grossly normal neurologically. Psych:  Alert and cooperative. Normal mood and affect.  Impression & Recommendations:  Problem # 1:  NAUSEA AND VOMITING (ICD-787.01) Assessment New 75YO MALE WITH 3 WEEK HX OF NAUSEA, VOMITING, AND DIARRHEA-PROGRESSIVE, WITH INABILITY TO KEEP DOWN ANY by mouth X 48 HOURS-R/O GASTROENTERITIS, R/O C.DIFF. ADMIT TO [facility] FOR REHYDRATION, ANTIEMETICS, BASELINE LABS, STOOL CULTURES START EMPIRIC FLAGYL IV FLORASTOR TWICE DAILY PROTONIX TWICE DAILY CONSIDER EGD/FLEX IF NO IMPROVEMENT IN NEXT FEW DAYS. SEE ORDERS  Problem # 2:  CORONARY ARTERY DISEASE (ICD-414.00) Assessment: Comment Only S/P PACEMAKER, S/P STENTS X 2, ON ASA, AND PLAVIX  Problem # 3:  OSTEOARTHRITIS (ICD-715.9) Assessment: Comment Only  Problem # 4:  HYPERTENSION (ICD-401.9) Assessment: Comment Only  Problem # 5:  DIARRHEA-PRESUMED INFECTIOUS (ICD-009.3) See Problem  #1.  Patient Instructions: 1)  ADMIT TO [facility] TODAY

## 2010-03-04 NOTE — Progress Notes (Signed)
Summary: triage  Phone Note Call from Patient Call back at Home Phone [phone]   Caller: Patient Call For: GESSNER Reason for Call: Talk to Nurse Summary of Call: Patient was discharge from the hosp last Thurs. and was told to f.u with gi doc in two weeks , next available is not until 5-16 pt can't wait that longbecause he is still having diarrhea. Initial call taken by: Tawni Levy,  May 21, 2009 11:11 AM  Follow-up for Phone Call        Pt. says that he has diarrhea right after taking his Questran.Is taking it t.i.d..Do you want him to see PA next week for hosp. f/u and please advise  on diarrhea. Follow-up by: Teryl Lucy RN,  May 21, 2009 11:28 AM  Additional Follow-up for Phone Call Additional follow up Details #1::        I will see him this afternoon looks like later in afternoon ok I think everett is coming 330 ish but I should be able to go to 11 patients  Additional Follow-up by: Iva Boop MD, Clementeen Graham,  May 21, 2009 12:24 PM    Additional Follow-up for Phone Call Additional follow up Details #2::    spoke to pt--he will come in at 3:30pm. Follow-up by: Francee Piccolo CMA Duncan Dull),  May 21, 2009 1:53 PM

## 2010-03-04 NOTE — Progress Notes (Signed)
Summary: Triage  Phone Note From Other Clinic   Caller: [facility]  [phone] Call For: Dr. Leone Payor Summary of Call: would like discuss mutual pt..... Diarrhea and vomitting x2wks. Given IV fluids today in office Initial call taken by: Karna Christmas,  May 09, 2009 12:43 PM  Follow-up for Phone Call        Patient  needs consult for nausea and diarrhea and screening colon.  Patient  will come in for rev with Amy Esterwood PA for 05/13/09 9:00.  Denny Peon will fax notes Follow-up by: Darcey Nora RN, CGRN,  May 09, 2009 2:27 PM

## 2010-03-04 NOTE — Progress Notes (Signed)
Summary: diarrhea  Phone Note Call from Patient Call back at Home Phone [phone]   Caller: Patient Call For: Dr. Leone Payor Reason for Call: Talk to Nurse Summary of Call: reporting that diarrhea started back yesterday, diarrhea all day yesterday... no blood in stool and no abd pain or vomiting Initial call taken by: Vallarie Mare,  Jun 10, 2009 9:18 AM  Follow-up for Phone Call        Patient  reports 10 episodes of watery diarrhea yesterday.  He reports one stool today.  He denies vomiting.  But reports a 4 # wt loss over night.  Patient  crrently on metronidazole and florastor.  please advise.  He has a follow up appointment 07/02/09.  See d/c summary for 06/06/09 Follow-up by: Darcey Nora RN, CGRN,  Jun 10, 2009 9:45 AM  Additional Follow-up for Phone Call Additional follow up Details #1::        he will need an assessment in office or ?ED by extender or MD I think he should be on vancomycin regardless talk to Amy Iva Boop MD, [facility]  Jun 10, 2009 10:12 AM     Additional Follow-up for Phone Call Additional follow up Details #2::    rev scheduled for today at 1:30 Follow-up by: Darcey Nora RN, CGRN,  Jun 10, 2009 10:27 AM

## 2010-03-04 NOTE — Letter (Signed)
Summary: [facility] Cancel and No Reschedule  [facility]  [address] [place], [place] [address]   Phone: [phone]  Fax: [phone]      February 18, 2009 MRN: [id number]   [facility] [address] [place], [place]  [address]     You recently cancelled your colonoscopy  procedure at the [facility] and did not reschedule for another date.    Your provider recommended this procedure for the benefit of your health.  It is very important that you reschedule it.  Failure to do so may be to the detriment of your health.  Please call us at [phone] and we will be happy to assist you with rescheduling.    If you were referred for this procedure by another physician/provider, we will notify him/her that you did not keep your appointment.   Sincerely,  Iva Boop, M.D.  [facility]

## 2010-03-04 NOTE — Initial Assessments (Signed)
Summary: diarrhea/vomiting/sheri   History of Present Illness Visit Type: Follow-up Visit Primary GI MD: Stan Head MD [facility] Primary Provider: Tracey Harries, MD Requesting Provider: na Chief Complaint: Patient complains of diarrhea and vomiting starting again at 2am. When he vomits it is all brown. Patient is scheduled to have a colonoscopy done on Thursday but dones not feel he can wait that long.  History of Present Illness:   PLEASANT 75 YO MALE KNOWN TO DR. Leone Payor WHO WAS RECENTLY HOSPITALIZED WITH C.DIFF COLITIS. HIS STOOL CULTURE WAS POSITIVE. HE WAS TREATED WITH A 10 DAY COURSE OF FLAGYL AND 3 WEEK COURSE OF FLORASTOR. HE PRESENTED WITH NAUSEA,VOMITING AND DIARRHEA. HE RECOVERED AND WAS DOING WELL,WITH 1-2 SOFT STOOLS PER DAY. HE WAS SCHEDULED FOR COLONOSCOPY WITH DR. Leone Payor LATER THIS WEEK. HE HAD ONSET LAST NIGHT WITH RECURRENT NAUSEA,VOMITING AND MULTIPLE EPISODES OF DIARRHEA. HE FEELS WEAK AND SOMEWHAT LIGHTHEADED. HE HAS NOT BEEN ABLE TO KEEP DOWN ANY by mouth'S.Marland Kitchen HE THINKS HE HAS VOMITED 6 TIMES-SOME BROWN MATERIAL. HE HAS HAD 6-7 WATERY,FOUL SMELLING STOOLS.   GI Review of Systems    Reports abdominal pain, loss of appetite, nausea, and  vomiting.     Location of  Abdominal pain: lower abdomen.    Denies acid reflux, belching, bloating, chest pain, dysphagia with liquids, dysphagia with solids, vomiting blood, and  weight loss.      Reports change in bowel habits and  diarrhea.     Denies anal fissure, black tarry stools, constipation, diverticulosis, fecal incontinence, heme positive stool, hemorrhoids, irritable bowel syndrome, jaundice, light color stool, liver problems, rectal bleeding, and  rectal pain.    Current Medications (verified): 1)  Plavix 75 Mg Tabs (Clopidogrel Bisulfate) .... Take 1 Tablet By Mouth Once A Day (Holding) 2)  Lipitor 40 Mg Tabs (Atorvastatin Calcium) .... Take 1 Tablet By Mouth Once A Day 3)  Lisinopril 2.5 Mg Tabs (Lisinopril) .... Take 1  Tablet By Mouth Once A Day 4)  Metoprolol Tartrate 50 Mg Tabs (Metoprolol Tartrate) .... Take 1 Tablet By Mouth Two Times A Day 5)  Prevacid 30 Mg Cpdr (Lansoprazole) .... Take 1 Capsule By Mouth As Needed 6)  Vitamin B-12 100 Mcg Tabs (Cyanocobalamin) .... Once Daily 7)  Aspirin 81 Mg Tbec (Aspirin) .... Take 1 Tablet By Mouth Once A Day 8)  Multivitamins  Tabs (Multiple Vitamin) .... Take 1 Tablet By Mouth Once A Day 9)  Afrin Nasal Spray 0.05 % Soln (Oxymetazoline Hcl) .... Once Daily 10)  Aleve 220 Mg Caps (Naproxen Sodium) .... Take One By Mouth As Needed  Allergies (verified): 1)  ! Allyson Sabal Flavors 2)  ! Niacin  Past History:  Past Surgical History: Last updated: 05/13/2009 Hernia Surgery-bilateral inguinal Hip Replacement-Bilateral PTCA-Stent Tonsillectomy pacemaker placed 2003  Family History: Last updated: 05/13/2009 Family History of Heart Disease: Mother, Father, Sisters, Brothers Family History of Diabetes: Sister and brother No FH of Colon Cancer: Family History of Breast Cancer:sister  Social History: Last updated: 05/13/2009 Married,wife with dementia Retired Patient is a former smoker. Quit in 1970. Alcohol Use - no Illicit Drug Use - no Patient gets regular exercise. Walking 3 x week  Past Medical History: Coronary Artery Disease PTCA and DES x 2 (2008, 2009),s/p pacemaker 2003 GERD C.DIFF COLITIS 4/11 /RELAPSED Hyperlipidemia Hypertension Myocardial Infarction Peptic Ulcer Disease-remote12-15 years ago Dislocated Rt. Hip  Review of Systems       The patient complains of arthritis/joint pain, fatigue, heart rhythm changes, night sweats, and thirst -  excessive.  The patient denies allergy/sinus, anemia, anxiety-new, back pain, blood in urine, breast changes/lumps, change in vision, confusion, cough, coughing up blood, depression-new, fainting, fever, headaches-new, hearing problems, heart murmur, itching, menstrual pain, muscle pains/cramps,  nosebleeds, pregnancy symptoms, shortness of breath, skin rash, sleeping problems, sore throat, swelling of feet/legs, swollen lymph glands, thirst - excessive , urination - excessive , urination changes/pain, urine leakage, vision changes, and voice change.         ROS OTHERWISE AS IN HPI  Vital Signs:  Patient profile:   75 year old male Height:      68 inches Weight:      173.0 pounds BMI:     26.40 Pulse rate:   100 / minute Pulse rhythm:   irregularly irregular BP sitting:   96 / 62  (left arm) Cuff size:   regular  Vitals Entered By: Harlow Mares CMA Duncan Dull) (Jun 04, 2009 3:05 PM)  Physical Exam  General:  Well developed, well nourished, no acute distress. Head:  Normocephalic and atraumatic. Eyes:  PERRLA, no icterus. Lungs:  Clear throughout to auscultation. Heart:  Regular rate and rhythm; no murmurs, rubs,  or bruits.,TACHYCARDIC Abdomen:  SOFT, MILDLY TENDER ACROSS LOWER ABDOMEN,BS+ Rectal:  NOT DONE Extremities:  1+ pedal edema.   Neurologic:  Alert and  oriented x4;  grossly normal neurologically. Psych:  Alert and cooperative. Normal mood and affect.   Impression & Recommendations:  Problem # 1:  CLOSTRIDIUM DIFFICILE COLITIS (ICD-008.45) Assessment Deteriorated 75 YO MALE WITH DOCUMENTED C.DIFF COLITIS ,ADMITTED 4/11-4/14/11,RESPONDED  TO FLAGYL WITH RESOLUTION OF SXS. NOW WITH RECURRENT NAUSEA,VOMITING, AND DIARRHEA WITH INABILITY TO KEEP DOWN by mouth'S. SXS CONSISTENT WITH RELAPSING C.DIFF. HE IS WEAK,TACHYCARDIC.  ADMIT TO [facility] FOR HYDRATION, ANTIEMETICS BASELINE LABS,STOOL CULTURES START ORAL VANCOMYCIN 250 MG 4 X DAILY START FLORASTOR 250 MG ;2 TWICE DAILY HILD ANTIHYPERTENSIVES CANCEL COLONOSCOPY FOR 5/5- WITH DR. Leone Payor.  Problem # 2:  CORONARY ARTERY DISEASE (ICD-414.00) Assessment: Comment Only  Problem # 3:  HYPERLIPIDEMIA (ICD-272.4) Assessment: Comment Only  Problem # 4:  HYPERTENSION (ICD-401.9) Assessment: Comment Only  Patient  Instructions: 1)  Admit,  2)  orders written,  3)  case discussed with Dr Verlee Monte brodie 4)  Copy sent to : Dr Leone Payor

## 2010-03-04 NOTE — Miscellaneous (Signed)
Summary: [facility] previsit  Clinical Lists Changes  Observations: Added new observation of ALLERGY REV: Done (05/28/2009 15:47) Pt already has the Moviprep at home due to cancellations from other apprts. No prep sent in today

## 2010-03-05 ENCOUNTER — Telehealth: Payer: Self-pay | Admitting: Internal Medicine

## 2010-03-06 NOTE — Progress Notes (Signed)
Summary: Triage  Phone Note Call from Patient Call back at Home Phone [phone]   Caller: Patient Call For: Dr. Leone Payor Reason for Call: Talk to Nurse Summary of Call: Released from hosp. yesterday w/a virus...continues to have diarrhea. Initial call taken by: Karna Christmas,  February 17, 2010 9:09 AM  Follow-up for Phone Call        Patient was seen in the ER at [facility]  Then admitted to [facility] over the weekend.  He is still having diarrhea.  he will come in and see Dr Leone Payor on 02/21/10 1:45 Follow-up by: Darcey Nora RN, CGRN,  February 17, 2010 10:37 AM

## 2010-03-06 NOTE — Assessment & Plan Note (Signed)
Summary: diarrhea/Jose Ibarra   History of Present Illness Visit Type: Follow-up Visit Primary GI MD: Stan Head MD [facility] Primary Provider: Tracey Harries, MD Requesting Provider: na Chief Complaint: diarrhea History of Present Illness:   Patient complains of diarrhea since Jan 5th. He has been to [facility] and the did some test and sent him to the [facility]. He states he was told he had a virus not cdiff. States that he feels like he has c diff like last time. He is having at least 2-3 BM per day and he is on a restricted diet of just liquids no solids. He has been on a liquid diet since last Friday. He tried to introduced solids but it caused vomiting. He is no longer having abdominal pain but does complain of bloating when he has liquid. he denies any blood in his stool. He is taking Lomotil four times a day and it is not improving his diarrhea.   See Urgent care note (reviewed) he got sick suddenly on 1/5 after being ok since spring (last visit here) No preceding antibiotics. He tried loperamide and it did not help. Was given Lomotil after 2 days by PCP. Was admitted at [facility]  Overnight admission, dicharged on Lomotil despite telling them it  hadn't worked. All liquid movements. Not nocturnal. No incontinence. Vomited some yesterday. No new medications, travel, sick contacts. He is on a liquid diet at this time solids either pass quickly or are vomited No fever    GI Review of Systems    Reports vomiting.      Denies abdominal pain, acid reflux, belching, bloating, chest pain, dysphagia with liquids, dysphagia with solids, heartburn, loss of appetite, nausea, vomiting blood, weight loss, and  weight gain.      Reports diarrhea.     Denies anal fissure, black tarry stools, change in bowel habit, constipation, diverticulosis, fecal incontinence, heme positive stool, hemorrhoids, irritable bowel syndrome, jaundice, light color stool, liver problems, rectal  bleeding, and  rectal pain.    Current Medications (verified): 1)  Lipitor 40 Mg Tabs (Atorvastatin Calcium) .... Take 1 Tablet By Mouth Once A Day 2)  Lisinopril 2.5 Mg Tabs (Lisinopril) .... Take 1 Tablet By Mouth Once A Day 3)  Metoprolol Tartrate 50 Mg Tabs (Metoprolol Tartrate) .... Take 1 Tablet By Mouth Two Times A Day 4)  Prevacid 30 Mg Cpdr (Lansoprazole) .... Take 1 Capsule By Mouth As Needed 5)  Vitamin B-12 100 Mcg Tabs (Cyanocobalamin) .... Once Daily 6)  Aspirin 81 Mg Tbec (Aspirin) .... Take 1 Tablet By Mouth Once A Day 7)  Multivitamins  Tabs (Multiple Vitamin) .... Take 1 Tablet By Mouth Once A Day 8)  Afrin Nasal Spray 0.05 % Soln (Oxymetazoline Hcl) .... Once Daily 9)  Tylenol Extra Strength 500 Mg Tabs (Acetaminophen) .... Take Two By Mouth Once Daily 10)  Vitamin D3 2000 Unit Caps (Cholecalciferol) .... Take One By Mouth Once Daily 11)  Lomotil 2.5-0.025 Mg Tabs (Diphenoxylate-Atropine) .... Take One By Mouth Four Times A Day  Allergies (verified): 1)  ! Allyson Sabal Flavors 2)  ! Niacin  Past History:  Past Medical History: Reviewed history from 06/10/2009 and no changes required. Coronary Artery Disease PTCA and DES x 2 (2008, 2009),s/p pacemaker 2003 GERD C.DIFF COLITIS 4/11 /RELAPSED 5/11. Hyperlipidemia Hypertension Myocardial Infarction Peptic Ulcer Disease-remote12-15 years ago Dislocated Rt. Hip  Past Surgical History: Hernia Surgery-bilateral inguinal Hip Replacement-Bilateral PTCA-Stent x 2 Tonsillectomy pacemaker placed 2003 Hemorrhoidectomy bilateral fermoral  hernia repair left hip revision 2011  Family History: Reviewed history from 05/13/2009 and no changes required. Family History of Heart Disease: Mother, Father, Sisters, Brothers Family History of Diabetes: Sister and brother No FH of Colon Cancer: Family History of Breast Cancer:sister  Social History: Reviewed history from 05/13/2009 and no changes required. Married,wife with  dementia Retired Patient is a former smoker. Quit in 1970. Alcohol Use - no Illicit Drug Use - no Patient gets regular exercise. Walking 3 x week  Vital Signs:  Patient profile:   75 year old male Height:      68 inches Weight:      185.6 pounds BMI:     28.32 Pulse rate:   88 / minute Pulse rhythm:   regular BP sitting:   88 / 60  (left arm) Cuff size:   regular  Vitals Entered By: Harlow Mares CMA Duncan Dull) (February 21, 2010 1:57 PM)  Physical Exam  General:  Well developed, well nourished, no acute distress. Eyes:   no icterus. Mouth:  No deformity or lesions, clear Lungs:  Clear throughout to auscultation. Heart:  Regular rate and rhythm; no murmurs, rubs,  or bruits. Abdomen:  SOFT, NONTENDER, NO MASS OR HSM,BS+ Rectal:  deferred until time of colonoscopy.   Extremities:  no edema Neurologic:  Alert and  oriented x4;  Cervical Nodes:  No significant cervical or supraclavicular adenopathy.  Psych:  Alert and cooperative. Normal mood and affect.   Impression & Recommendations:  Problem # 1:  DIARRHEA (ICD-787.91) No good evidence for C diff or other infectious cause at this time - need to clarify cause so will evaluate with colonoscopy Risks, benefits,and indications of endoscopic procedure(s) were reviewed with the patient and all questions answered.  Orders: Colonoscopy (Colon)  Problem # 2:  NONSPECIFIC ABN FINDING RAD & OTH EXAM GI TRACT (ICD-793.4) 02/15/2010 CT abd/pelvis   1.  Fluid filled colon, compatible with diarrheal process.   2.  3.6 cm fluid density adjacent to the left inguinal ring in the   abdomen, possibly a postoperative fluid collection, or less likely   abscess or adenopathy.  This area is obscured by streak artifact   from the patient's hip implants.  Although the adjacent sigmoid   colon demonstrates numerous diverticula, I do not discern   inflammatory stranding in this vicinity.   3.  Small umbilical hernia contains only adipose  tissue.   4.  Mild wall thickening in the gastric antrum, possibly from   antritis.   5.  Renal cysts.   6.  Subsegmental atelectasis in both lung bases.   7.  Lower lumbar spondylosis.   SUSPECT THESE FINDINGS ARE CLINICALLY INSGNIFICANT  Patient Instructions: 1)  Colon [facility] 02/24/10 11:00 am arrive at 10:00 am on 4th floor.  2)  Use the Moviprep you already have at home. 3)  Instructions given. 4)  We will give you refills or new prescriptions after colonoscopy is complete on Monday. Dr. Leone Payor will advise. 5)  Copy sent to : Tracey Harries, MD 6)  The medication list was reviewed and reconciled.  All changed / newly prescribed medications were explained.  A complete medication list was provided to the patient / caregiver.

## 2010-03-06 NOTE — Miscellaneous (Signed)
Summary: promethazine  Clinical Lists Changes  Medications: Added new medication of PROMETHAZINE HCL 12.5 MG  TABS (PROMETHAZINE HCL) 1 by mouth every 8-12 hours as needed for nausea and vomiting may take 2 if needed - Signed Rx of PROMETHAZINE HCL 12.5 MG  TABS (PROMETHAZINE HCL) 1 by mouth every 8-12 hours as needed for nausea and vomiting may take 2 if needed;  #30 x 0;  Signed;  Entered by: Iva Boop MD, Clementeen Graham;  Authorized by: Iva Boop MD, [facility];  Method used: [facility] [phone]*, [address] [place], [place], [place]  [address], Ph: [phone], Fax: [phone]    Prescriptions: PROMETHAZINE HCL 12.5 MG  TABS (PROMETHAZINE HCL) 1 by mouth every 8-12 hours as needed for nausea and vomiting may take 2 if needed  #30 x 0   Entered and Authorized by:   Iva Boop MD, [facility]   Signed by:   Iva Boop MD, [facility] on 02/28/2010   Method used:   Electronically to        [facility] [phone]* (retail)       [address]       [place], [place]  [address]       Ph: [phone]       Fax: [phone]   RxID:   [id number]

## 2010-03-06 NOTE — Letter (Signed)
Summary: [facility] Instructions  [facility]  [address] [place], [place] [address]   Phone: [phone]  Fax: [phone]       Jose Ibarra    January 09, 1936    MRN: [id number]        Procedure Day /Date:MONDAY 02/24/10     Arrival Time:10:00 AM     Procedure Time:11:00 AM     Location of Procedure:                    X  [facility] (4th Floor)             PREPARATION FOR COLONOSCOPY WITH MOVIPREP   Starting 5 days prior to your procedure 02/19/10 do not eat nuts, seeds, popcorn, corn, beans, peas,  salads, or any raw vegetables.  Do not take any fiber supplements (e.g. Metamucil, Citrucel, and Benefiber).  THE DAY BEFORE YOUR PROCEDURE         DATE: 02/23/10 ONG:EXBMWU  1.  Drink clear liquids the entire day-NO SOLID FOOD  2.  Do not drink anything colored red or purple.  Avoid juices with pulp.  No orange juice.  3.  Drink at least 64 oz. (8 glasses) of fluid/clear liquids during the day to prevent dehydration and help the prep work efficiently.  CLEAR LIQUIDS INCLUDE: Water Jello Ice Popsicles Tea (sugar ok, no milk/cream) Powdered fruit flavored drinks Coffee (sugar ok, no milk/cream) Gatorade Juice: apple, white grape, white cranberry  Lemonade Clear bullion, consomm, broth Carbonated beverages (any kind) Strained chicken noodle soup Hard Candy                             4.  In the morning, mix first dose of MoviPrep solution:    Empty 1 Pouch A and 1 Pouch B into the disposable container    Add lukewarm drinking water to the top line of the container. Mix to dissolve    Refrigerate (mixed solution should be used within 24 hrs)  5.  Begin drinking the prep at 5:00 p.m. The MoviPrep container is divided by 4 marks.   Every 15 minutes drink the solution down to the next mark (approximately 8 oz) until the full liter is complete.   6.  Follow completed prep with 16 oz of clear liquid of your choice (Nothing red or purple).   Continue to drink clear liquids until bedtime.  7.  Before going to bed, mix second dose of MoviPrep solution:    Empty 1 Pouch A and 1 Pouch B into the disposable container    Add lukewarm drinking water to the top line of the container. Mix to dissolve    Refrigerate  THE DAY OF YOUR PROCEDURE      DATE: 02/24/10 XLK:GMWNUU  Beginning at 6:00 a.m. (5 hours before procedure):         1. Every 15 minutes, drink the solution down to the next mark (approx 8 oz) until the full liter is complete.  2. Follow completed prep with 16 oz. of clear liquid of your choice.    3. You may drink clear liquids until 9:00 AM(2 HOURS BEFORE PROCEDURE).   MEDICATION INSTRUCTIONS  Unless otherwise instructed, you should take regular prescription medications with a small sip of water   as early as possible the morning of your procedure.         OTHER INSTRUCTIONS  You will need a responsible adult  at least 73 years of age to accompany you and drive you home.   This person must remain in the waiting room during your procedure.  Wear loose fitting clothing that is easily removed.  Leave jewelry and other valuables at home.  However, you may wish to bring a book to read or  an iPod/MP3 player to listen to music as you wait for your procedure to start.  Remove all body piercing jewelry and leave at home.  Total time from sign-in until discharge is approximately 2-3 hours.  You should go home directly after your procedure and rest.  You can resume normal activities the  day after your procedure.  The day of your procedure you should not:   Drive   Make legal decisions   Operate machinery   Drink alcohol   Return to work  You will receive specific instructions about eating, activities and medications before you leave.    The above instructions have been reviewed and explained to me by   _______________________    I fully understand and can verbalize these instructions  _____________________________ Date _________

## 2010-03-06 NOTE — Procedures (Addendum)
Summary: Upper Endoscopy  Patient: [name] Note: All result statuses are Final unless otherwise noted.  Tests: (1) Upper Endoscopy (EGD)   EGD Upper Endoscopy       DONE     [facility]     [address]     [place], [place]  [address]           ENDOSCOPY PROCEDURE REPORT           PATIENT:  Jose Ibarra, Jose Ibarra  MR#:  [id number]     BIRTHDATE:  11-06-1935, 74 yrs. old  GENDER:  male           ENDOSCOPIST:  Iva Boop, MD, [facility]           PROCEDURE DATE:  02/28/2010     PROCEDURE:  EGD with biopsy, 19[address]     ASA CLASS:  Class III     INDICATIONS:  nausea and vomiting, diarrhea increasing nausea and     vomiting in setting of diarrhea problems (for 3 weeks)     cause not yet clear           MEDICATIONS:   Fentanyl 50 mcg IV, Versed 5 mg IV     TOPICAL ANESTHETIC:  Exactacain Spray           DESCRIPTION OF PROCEDURE:   After the risks benefits and     alternatives of the procedure were thoroughly explained, informed     consent was obtained.  The LB GIF-H180 [id number] endoscope was     introduced through the mouth and advanced to the second portion of     the duodenum, without limitations.  The instrument was slowly     withdrawn as the mucosa was fully examined.     <<PROCEDUREIMAGES>>           Moderate gastritis was found in the antrum. Multiple biopsies were     obtained and sent to pathology.  A diverticulum was found pyloric     channel Medium-sized opening just past pyloric opening, in     channel.  Otherwise the examination was normal.    Retroflexed     views revealed no abnormalities.    The scope was then withdrawn     from the patient and the procedure completed.           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     2) Promethazine 12.5mg  1 tablet every 8-12 hours as needed for     nausea and vomiting - prescription sent     3) Dr. Marvell Fuller office will contact you when colonoscopy biopsies     are in as well as the biopsies from today           Iva Boop, MD, Clementeen Graham           CC:  Tracey Harries, MD     The Patient           n.     Rosalie Doctor:   Iva Boop at 02/28/2010 12:53 PM  [place], [place], [id number]  Note: An exclamation mark (!) indicates a result that was not dispersed into the flowsheet. Document Creation Date: 02/28/2010 12:54 PM _______________________________________________________________________  (1) Order result status: Final Collection or observation date-time: 02/28/2010 12:23 Requested date-time:  Receipt date-time:  Reported date-time:  Referring Physician:   Ordering Physician: Stan Head [phone]) Specimen Source:  Source: Launa Grill Order Number: [id number] Lab site:

## 2010-03-06 NOTE — Progress Notes (Signed)
Summary: Triage  Phone Note Call from Patient Call back at Home Phone [phone]   Caller: Patient Call For: Dr. Leone Payor Reason for Call: Talk to Nurse Summary of Call: Pt is having diarrhea and vomiting, can not keep in anything in Initial call taken by: [place] Johnson,  February 27, 2010 2:32 PM  Follow-up for Phone Call        Patient stated that he is not able to eat anything.  He is vomiting up all solid foods for the last two days. He ate a chicken sadwich and mashed potato and had vomiting soon after.   Is able to tolerate clear liquids only.  Lomotil is not helping at all with the diarrhea.  I have advised him to stay on a clear liquid diet l, lomotil as needed, push fluids.  Dr Leone Payor please advise. Follow-up by: Darcey Nora RN, CGRN,  February 27, 2010 3:58 PM  Additional Follow-up for Phone Call Additional follow up Details #1::        Vomiting worse See if we can add him on at 1130 tomorrow for an EGD   Additional Follow-up by: Iva Boop MD, Clementeen Graham,  February 27, 2010 4:23 PM    Additional Follow-up for Phone Call Additional follow up Details #2::    Patient is scheduled for EGD this am at 11:30 with Dr Leone Payor.  He was given verbla instructions last night.  EMR was down last evening.   Follow-up by: Darcey Nora RN, CGRN,  February 28, 2010 8:25 AM

## 2010-03-06 NOTE — Procedures (Addendum)
Summary: Colonoscopy  Patient: Jose Ibarra Note: All result statuses are Final unless otherwise noted.  Tests: (1) Colonoscopy (COL)   COL Colonoscopy           DONE     [facility]     [address]     [place], [place]  [address]           COLONOSCOPY PROCEDURE REPORT           PATIENT:  Jose Ibarra, Jose Ibarra  MR#:  [id number]     BIRTHDATE:  19-Jan-1936, 74 yrs. old  GENDER:  male     ENDOSCOPIST:  Iva Boop, MD, [facility]           PROCEDURE DATE:  02/24/2010     PROCEDURE:  Colonoscopy with biopsy     ASA CLASS:  Class III     INDICATIONS:  unexplained diarrhea     MEDICATIONS:   Fentanyl 50 mcg IV, Versed 5 mg IV           DESCRIPTION OF PROCEDURE:   After the risks benefits and     alternatives of the procedure were thoroughly explained, informed     consent was obtained.  Digital rectal exam was performed and     revealed no abnormalities and normal prostate.   The [id number] [id number]     endoscope was introduced through the anus and advanced to the     terminal ileum which was intubated for a short distance, without     limitations.  The quality of the prep was excellent, using     MoviPrep.  The instrument was then slowly withdrawn as the colon     was fully examined. Insertion: 3:5minutes Withdrawal: 9:47     minutes     <<PROCEDUREIMAGES>>           FINDINGS:  Nodular mucosa was found terminal ileum Multiple     biopsies were obtained and sent to pathology.  Abnormal appearing     mucosa in the right colon. Friable with diffuse minor submucosal     hemorrhage after manipulation to enter terminal ileum.  Abnormal     appearing mucosa in the left colon. Patchy erythema, loss of     vascular pattern with some rare friability and submucosal heme.     Multiple biopsies were obtained and sent to pathology.  This was     otherwise a normal examination of the colon.   Retroflexed views     in the rectum revealed no abnormalities.    The scope was then     withdrawn from the  patient and the procedure completed.           COMPLICATIONS:  None     ENDOSCOPIC IMPRESSION:     1) Nodular mucosa in the terminal ileum - biopsied     2) Abnormal mucosa in the right colon - friable - biopsied     3) Abnormal mucosa in the left colon - patchy erythema and loss     of vascular pattern - biopsied     4) Otherwise normal examination, excellent prep     RECOMMENDATIONS:     1) Await biopsy results     2) Low residue, low fiber diet     3) We will call later this week with results and plans.           Iva Boop, MD, Clementeen Graham  CC:  The Patient     Tracey Harries, MD           n.     eSIGNED:   Iva Boop at 02/24/2010 11:48 AM           Dorothe Pea, [id number]  Note: An exclamation mark (!) indicates a result that was not dispersed into the flowsheet. Document Creation Date: 02/24/2010 11:48 AM _______________________________________________________________________  (1) Order result status: Final Collection or observation date-time: 02/24/2010 11:34 Requested date-time:  Receipt date-time:  Reported date-time:  Referring Physician:   Ordering Physician: Stan Head [phone]) Specimen Source:  Source: Launa Grill Order Number: [id number] Lab site:

## 2010-03-12 NOTE — Progress Notes (Signed)
Summary: gastric bxs  Phone Note Outgoing Call   Summary of Call: Let hinm know gastric biopsies show similar inflammatoery process as colon and I expect and hope that prednisone is helping will discuss more at f/u Iva Boop MD, [facility]  March 05, 2010 2:41 PM   Follow-up for Phone Call        Phoned patient to ask how he was doing and give him results and he stated diarrhea has flared up again. Reports watery light brown diarrhea x 2 days. Patient is mainly eating broth and Sprite, but also makes milkshakes from milk and ice cream. Verified his f/u on 03/25/10. Any further orders? Thanks.Graciella Freer RN  March 05, 2010 3:06 PM   Additional Follow-up for Phone Call Additional follow up Details #1::        Have him go back to Four 10 mg tabs (can take 20 two times a day) of the prednisone until Monday Feb 6. I want him to call us with an update then. We will probably need to send him extra prednisone at some point. He may still use Lomotil as needed and we can refill that if required. Additional Follow-up by: Iva Boop MD, Clementeen Graham,  March 06, 2010 8:11 AM    Additional Follow-up for Phone Call Additional follow up Details #2::    Spoke with patient who has been taking Prednisone, 2-10mg  tabs three times a day? Instructed him to decrease to 2-10mg  tabs two times a day until 03/10/10 ; he should call in and update [place]. Patient stated the diarrhea has slacked off some, but he stopped the milkshakes. Patient had no Lomotill; ordered Lomotil and Prednisone to [facility]. Graciella Freer RN  March 06, 2010 1:43 PM     New/Updated Medications: PREDNISONE 10 MG  TABS (PREDNISONE) Take as directed by Dr Leone Payor Prescriptions: PREDNISONE 10 MG  TABS (PREDNISONE) Take as directed by Dr Leone Payor  #30 x 1   Entered by:   Graciella Freer RN   Authorized by:   Iva Boop MD, [facility]   Signed by:   Graciella Freer RN on 03/06/2010   Method used:   Printed then  faxed to ...       [facility] [phone]* (retail)       [address]       [place], [place]  [address]       Ph: [phone]       Fax: [phone]   RxID:   [id number] LOMOTIL 2.5-0.025 MG TABS (DIPHENOXYLATE-ATROPINE) take one by mouth four times a day  #30 x 0   Entered by:   Graciella Freer RN   Authorized by:   Iva Boop MD, [facility]   Signed by:   Graciella Freer RN on 03/06/2010   Method used:   Printed then faxed to ...       [facility] [phone]* (retail)       [address]       [place], [place]  [address]       Ph: [phone]       Fax: [phone]   RxID:   [id number]

## 2010-03-12 NOTE — Progress Notes (Signed)
Summary: gastric biopsy result call  Phone Note From Other Clinic   Caller: Dr. Frederica Kuster Summary of Call: Gastric biopsies show lymphocytic gastritis he is on prednisone at this time (also has lymphocytic colitis) Initial call taken by: Iva Boop MD, Clementeen Graham,  March 04, 2010 9:37 AM

## 2010-03-25 ENCOUNTER — Ambulatory Visit (INDEPENDENT_AMBULATORY_CARE_PROVIDER_SITE_OTHER): Payer: Medicare PPO | Admitting: Internal Medicine

## 2010-03-25 ENCOUNTER — Encounter: Payer: Self-pay | Admitting: Internal Medicine

## 2010-03-25 DIAGNOSIS — K5289 Other specified noninfective gastroenteritis and colitis: Secondary | ICD-10-CM

## 2010-03-25 DIAGNOSIS — R935 Abnormal findings on diagnostic imaging of other abdominal regions, including retroperitoneum: Secondary | ICD-10-CM

## 2010-03-25 DIAGNOSIS — K2970 Gastritis, unspecified, without bleeding: Secondary | ICD-10-CM

## 2010-03-25 DIAGNOSIS — K2990 Gastroduodenitis, unspecified, without bleeding: Secondary | ICD-10-CM

## 2010-04-01 NOTE — Assessment & Plan Note (Signed)
Summary: follow up office visit/Jose Ibarra   Vital Signs:  Patient profile:   75 year old male Height:      68 inches Weight:      183 pounds BMI:     27.93 Pulse rate:   96 / minute Pulse rhythm:   regular BP sitting:   108 / 68  (left arm)  Vitals Entered By: [name] NCMA (March 25, 2010 3:16 PM)  History of Present Illness Visit Type: Follow-up Visit Primary GI MD: Stan Head MD [facility] Primary Provider: Tracey Harries, MD Requesting Provider: na Chief Complaint: follow-up procedures pt. states he is doing better, no complaints History of Present Illness:    75 year old African American man with recent diagnosis of lymphocytic colitis and lymphocytic gastritis. he had diarrhea and then developed nausea and vomiting. He was treated with a tapering course of prednisone which he has finished and he says he has no more symptoms.   He does have some persistent left buttock pain. he knows there was a fluid collection seen in the inguinal region on the left, at CT scan in January 2012. he spoke to his orthopedic surgeon and said that that fluid collection was unrelated to hip replacement last year. the patient has had 2 hernia repairs in the past. he denies groin pain or lower, pain at this time.    we reviewed his medication list, and he has been using over-the-counter Prevacid intermittently for up to 14 days at a time. I explained that this is one possible cause of his lymphocytic gastritis and colitis. He says that he really only uses that at times when he has diet induced heartburn or indigestion. One example would be salads triggering this, especially when he eats out. Prior to moving here he was on Nexium daily but the co-pay and caused became too expensive so he switched to intermittent PPI therapy which has not really caused him any significant increase in symptoms.   GI Review of Systems      Denies abdominal pain, acid reflux, belching, bloating, chest pain, dysphagia with  liquids, dysphagia with solids, heartburn, loss of appetite, nausea, vomiting, vomiting blood, weight loss, and  weight gain.        Denies anal fissure, black tarry stools, change in bowel habit, constipation, diarrhea, diverticulosis, fecal incontinence, heme positive stool, hemorrhoids, irritable bowel syndrome, jaundice, light color stool, liver problems, rectal bleeding, and  rectal pain.  Current Medications (verified): 1)  Lipitor 40 Mg Tabs (Atorvastatin Calcium) .... Take 1 Tablet By Mouth Once A Day 2)  Metoprolol Tartrate 50 Mg Tabs (Metoprolol Tartrate) .... Take 1 Tablet By Mouth Two Times A Day 3)  Prevacid 24hr 15 Mg Cpdr (Lansoprazole) .Marland Kitchen.. 1 By Mouth As Needed 4)  Vitamin B-12 100 Mcg Tabs (Cyanocobalamin) .... Once Daily 5)  Aspirin 81 Mg Tbec (Aspirin) .... Take 1 Tablet By Mouth Once A Day 6)  Multivitamins  Tabs (Multiple Vitamin) .... Take 1 Tablet By Mouth Once A Day 7)  Afrin Nasal Spray 0.05 % Soln (Oxymetazoline Hcl) .... Once Daily 8)  Tylenol Extra Strength 500 Mg Tabs (Acetaminophen) .... Take Two By Mouth Once Daily 9)  Vitamin D3 2000 Unit Caps (Cholecalciferol) .... Take One By Mouth Once Daily 10)  Losartan Potassium 25 Mg Tabs (Losartan Potassium) .Marland Kitchen.. 1 By Mouth Once Daily 11)  Osteo Bi-Flex Adv Joint Shield  Tabs (Misc Natural Products) .... Take 1 By Mouth Once Daily 12)  Vitamin C 1000 Mg Tabs (Ascorbic Acid) .Marland KitchenMarland KitchenMarland Kitchen  1 By Mouth Once Daily  Allergies (verified): 1)  ! Allyson Sabal Flavors 2)  ! Niacin  Past History:  Past Medical History: Reviewed history from 06/10/2009 and no changes required. Coronary Artery Disease PTCA and DES x 2 (2008, 2009),s/p pacemaker 2003 GERD C.DIFF COLITIS 4/11 /RELAPSED 5/11. Hyperlipidemia Hypertension Myocardial Infarction Peptic Ulcer Disease-remote12-15 years ago Dislocated Rt. Hip  Past Surgical History: Reviewed history from 02/21/2010 and no changes required. Hernia Surgery-bilateral inguinal Hip  Replacement-Bilateral PTCA-Stent x 2 Tonsillectomy pacemaker placed 2003 Hemorrhoidectomy bilateral fermoral hernia repair left hip revision 2011  Family History: Reviewed history from 05/13/2009 and no changes required. Family History of Heart Disease: Mother, Father, Sisters, Brothers Family History of Diabetes: Sister and brother No FH of Colon Cancer: Family History of Breast Cancer:sister  Social History: Reviewed history from 05/13/2009 and no changes required. Married,wife with dementia Retired Patient is a former smoker. Quit in 1970. Alcohol Use - no Illicit Drug Use - no Patient gets regular exercise. Walking 3 x week  Physical Exam  General:  Well developed, well nourished, no acute distress.   Impression & Recommendations:  Problem # 1:  COLITIS (ICD-558.9) Assessment Improved Lymphocytic colitis is in remission at this time. plan to observe. Since Prevacid as a possible trigger Will switch to an intermittent H2 blocker for GERD symptoms.  Problem # 2:  GASTRITIS, ACUTE (ICD-535.00) Assessment: Improved Lymphocytic gastritis is in remission as best we can tell. observe for recurrence. Stop Prevacid as above.  Problem # 3:  NONSPEC ABN FINDNG RAD & OTH EXAM ABDOMINAL AREA (ICD-793.6) Assessment: New Fluid  collections seen in inguinal region on the left, CT scan abdomen and pelvis of mid January 2012. I spoke to radiology, in order to follow this up a limited pelvic CT scan without oral or IV contrast is recommended. Plan for per doing that in about 3 months from the first scan which would be about mid April. I explained to the patient that I think this is most likely something leftover from his inguinal hernia repair, probably a lymphocele. This is a new finding as far as we can tell, he will be prudent to look for stability or resolution , if it is changing further workup.  I also explained that this should not be related to the buttock pain that he is having on  the left.  Patient Instructions: 1)  Stop Prevacid as it has been linked to the stomach and colitis problems you have had. 2)  Use ranitidine over the counter as listed below to help with indigestion and heartburn you have with certain foods. 3)  Copy sent to : Tracey Harries, MD 4)  The medication list was reviewed and reconciled.  All changed / newly prescribed medications were explained.  A complete medication list was provided to the patient / caregiver.

## 2010-04-20 LAB — DIFFERENTIAL
Basophils Relative: 0 % (ref 0–1)
Lymphocytes Relative: 10 % — ABNORMAL LOW (ref 12–46)
Lymphs Abs: 0.8 10*3/uL (ref 0.7–4.0)
Monocytes Absolute: 0.5 10*3/uL (ref 0.1–1.0)
Monocytes Relative: 6 % (ref 3–12)
Neutro Abs: 7 10*3/uL (ref 1.7–7.7)
Neutrophils Relative %: 83 % — ABNORMAL HIGH (ref 43–77)

## 2010-04-20 LAB — BASIC METABOLIC PANEL
BUN: 22 mg/dL (ref 6–23)
CO2: 28 mEq/L (ref 19–32)
Calcium: 8.4 mg/dL (ref 8.4–10.5)
Chloride: 104 mEq/L (ref 96–112)
Creatinine, Ser: 0.89 mg/dL (ref 0.4–1.5)
GFR calc Af Amer: >60 mL/min (ref >60)
GFR calc non Af Amer: >60 mL/min (ref >60)
Glucose, Bld: 115 mg/dL — ABNORMAL HIGH (ref 70–99)
Potassium: 3.9 mEq/L (ref 3.5–5.1)
Sodium: 138 mEq/L (ref 135–145)

## 2010-04-20 LAB — CBC
HCT: 40.8 % (ref 39.0–52.0)
Hemoglobin: 14 g/dL (ref 13.0–17.0)
MCHC: 34.3 g/dL (ref 30.0–36.0)
MCV: 91.6 fL (ref 78.0–100.0)
Platelets: 128 10*3/uL — ABNORMAL LOW (ref 150–400)
RBC: 4.45 MIL/uL (ref 4.22–5.81)
RDW: 13.6 % (ref 11.5–15.5)
WBC: 8.4 10*3/uL (ref 4.0–10.5)

## 2010-04-22 LAB — CBC
Hemoglobin: 14.2 g/dL (ref 13.0–17.0)
MCHC: 32.8 g/dL (ref 30.0–36.0)
RBC: 4.71 MIL/uL (ref 4.22–5.81)
WBC: 7 10*3/uL (ref 4.0–10.5)

## 2010-04-22 LAB — FECAL LACTOFERRIN, QUANT: Fecal Lactoferrin: POSITIVE

## 2010-04-22 LAB — DIFFERENTIAL
Basophils Relative: 0 % (ref 0–1)
Eosinophils Absolute: 0 10*3/uL (ref 0.0–0.7)
Eosinophils Relative: 0 % (ref 0–5)
Lymphs Abs: 0.9 10*3/uL (ref 0.7–4.0)
Monocytes Relative: 10 % (ref 3–12)

## 2010-04-22 LAB — COMPREHENSIVE METABOLIC PANEL
ALT: 30 U/L (ref 0–53)
AST: 32 U/L (ref 0–37)
Alkaline Phosphatase: 53 U/L (ref 39–117)
CO2: 25 mEq/L (ref 19–32)
Calcium: 8.7 mg/dL (ref 8.4–10.5)
GFR calc Af Amer: >60 mL/min (ref >60)
GFR calc non Af Amer: >60 mL/min (ref >60)
Glucose, Bld: 127 mg/dL — ABNORMAL HIGH (ref 70–99)
Potassium: 3.8 mEq/L (ref 3.5–5.1)
Sodium: 139 mEq/L (ref 135–145)

## 2010-04-22 LAB — CLOSTRIDIUM DIFFICILE EIA

## 2010-04-23 LAB — CLOSTRIDIUM DIFFICILE EIA

## 2010-04-23 LAB — BASIC METABOLIC PANEL
BUN: 1 mg/dL — ABNORMAL LOW (ref 6–23)
BUN: 1 mg/dL — ABNORMAL LOW (ref 6–23)
CO2: 28 mEq/L (ref 19–32)
Calcium: 8 mg/dL — ABNORMAL LOW (ref 8.4–10.5)
Calcium: 8 mg/dL — ABNORMAL LOW (ref 8.4–10.5)
Creatinine, Ser: 0.88 mg/dL (ref 0.4–1.5)
Creatinine, Ser: 0.88 mg/dL (ref 0.4–1.5)
Creatinine, Ser: 0.96 mg/dL (ref 0.4–1.5)
GFR calc Af Amer: >60 mL/min (ref >60)
GFR calc non Af Amer: >60 mL/min (ref >60)
GFR calc non Af Amer: >60 mL/min (ref >60)
Glucose, Bld: 92 mg/dL (ref 70–99)
Glucose, Bld: 99 mg/dL (ref 70–99)

## 2010-04-23 LAB — COMPREHENSIVE METABOLIC PANEL
ALT: 41 U/L (ref 0–53)
AST: 40 U/L — ABNORMAL HIGH (ref 0–37)
Albumin: 3.2 g/dL — ABNORMAL LOW (ref 3.5–5.2)
Alkaline Phosphatase: 59 U/L (ref 39–117)
Chloride: 108 mEq/L (ref 96–112)
GFR calc Af Amer: >60 mL/min (ref >60)
Potassium: 2.7 mEq/L — CL (ref 3.5–5.1)
Sodium: 141 mEq/L (ref 135–145)
Total Bilirubin: 0.8 mg/dL (ref 0.3–1.2)

## 2010-04-23 LAB — DIFFERENTIAL
Basophils Absolute: 0 10*3/uL (ref 0.0–0.1)
Basophils Relative: 0 % (ref 0–1)
Eosinophils Absolute: 0 10*3/uL (ref 0.0–0.7)
Eosinophils Relative: 0 % (ref 0–5)
Lymphocytes Relative: 16 % (ref 12–46)
Monocytes Absolute: 1 10*3/uL (ref 0.1–1.0)

## 2010-04-23 LAB — CBC
Platelets: 227 10*3/uL (ref 150–400)
RBC: 4.21 MIL/uL — ABNORMAL LOW (ref 4.22–5.81)
WBC: 7.1 10*3/uL (ref 4.0–10.5)

## 2010-04-23 LAB — URINE MICROSCOPIC-ADD ON

## 2010-04-23 LAB — URINALYSIS, ROUTINE W REFLEX MICROSCOPIC
Hgb urine dipstick: NEGATIVE
Specific Gravity, Urine: 1.028 (ref 1.005–1.030)
Urobilinogen, UA: 0.2 mg/dL (ref 0.0–1.0)

## 2010-04-23 LAB — OVA AND PARASITE EXAMINATION

## 2010-04-23 LAB — STOOL CULTURE

## 2010-04-28 LAB — COMPREHENSIVE METABOLIC PANEL
Albumin: 3.4 g/dL — ABNORMAL LOW (ref 3.5–5.2)
BUN: 15 mg/dL (ref 6–23)
Chloride: 106 mEq/L (ref 96–112)
Creatinine, Ser: 0.8 mg/dL (ref 0.4–1.5)
Glucose, Bld: 84 mg/dL (ref 70–99)
Total Bilirubin: 0.6 mg/dL (ref 0.3–1.2)

## 2010-04-28 LAB — DIFFERENTIAL
Basophils Absolute: 0 10*3/uL (ref 0.0–0.1)
Lymphocytes Relative: 9 % — ABNORMAL LOW (ref 12–46)
Monocytes Absolute: 1.1 10*3/uL — ABNORMAL HIGH (ref 0.1–1.0)
Neutro Abs: 6 10*3/uL (ref 1.7–7.7)
Neutrophils Relative %: 76 % (ref 43–77)

## 2010-04-28 LAB — CBC
HCT: 41.5 % (ref 39.0–52.0)
MCV: 90.4 fL (ref 78.0–100.0)
Platelets: 137 10*3/uL — ABNORMAL LOW (ref 150–400)
RDW: 12.6 % (ref 11.5–15.5)
WBC: 7.9 10*3/uL (ref 4.0–10.5)

## 2010-04-28 LAB — POCT CARDIAC MARKERS

## 2010-04-28 LAB — LIPASE, BLOOD: Lipase: 135 U/L (ref 23–300)

## 2010-05-23 ENCOUNTER — Other Ambulatory Visit: Payer: Self-pay | Admitting: Internal Medicine

## 2010-05-23 DIAGNOSIS — R9389 Abnormal findings on diagnostic imaging of other specified body structures: Secondary | ICD-10-CM

## 2010-05-23 NOTE — Progress Notes (Signed)
Patient is scheduled for a repeat CT scan per Dr Leone Payor needs to be pelvis without or or IV contrast to follow up abnormal CT scan from 02/2010

## 2010-05-29 ENCOUNTER — Ambulatory Visit (INDEPENDENT_AMBULATORY_CARE_PROVIDER_SITE_OTHER)
Admission: RE | Admit: 2010-05-29 | Discharge: 2010-05-29 | Disposition: A | Discharge status: Home / Self Care | Payer: Medicare PPO | Source: Ambulatory Visit | Attending: Internal Medicine | Admitting: Internal Medicine

## 2010-05-29 DIAGNOSIS — R9389 Abnormal findings on diagnostic imaging of other specified body structures: Secondary | ICD-10-CM

## 2010-05-29 NOTE — Progress Notes (Signed)
Quick Note:  Let Jose Ibarra know that the fluid collection in the region of the previous hernia repair is stable. This goes along with a post operative collection and I would do nothing further at this. He can follow up with me as needed. ______

## 2010-05-30 NOTE — Progress Notes (Signed)
I have left a message for the patient with the results and asked him to call back for any questions.

## 2011-01-08 ENCOUNTER — Emergency Department (HOSPITAL_BASED_OUTPATIENT_CLINIC_OR_DEPARTMENT_OTHER)
Admission: EM | Admit: 2011-01-08 | Discharge: 2011-01-08 | Disposition: A | Discharge status: Home / Self Care | Payer: Medicare PPO | Attending: Emergency Medicine | Admitting: Emergency Medicine

## 2011-01-08 ENCOUNTER — Encounter: Payer: Self-pay | Admitting: *Deleted

## 2011-01-08 ENCOUNTER — Emergency Department (INDEPENDENT_AMBULATORY_CARE_PROVIDER_SITE_OTHER): Payer: Medicare PPO

## 2011-01-08 DIAGNOSIS — I2510 Atherosclerotic heart disease of native coronary artery without angina pectoris: Secondary | ICD-10-CM | POA: Insufficient documentation

## 2011-01-08 DIAGNOSIS — X500XXA Overexertion from strenuous movement or load, initial encounter: Secondary | ICD-10-CM | POA: Insufficient documentation

## 2011-01-08 DIAGNOSIS — S39012A Strain of muscle, fascia and tendon of lower back, initial encounter: Secondary | ICD-10-CM

## 2011-01-08 DIAGNOSIS — Z79899 Other long term (current) drug therapy: Secondary | ICD-10-CM | POA: Insufficient documentation

## 2011-01-08 DIAGNOSIS — K219 Gastro-esophageal reflux disease without esophagitis: Secondary | ICD-10-CM | POA: Insufficient documentation

## 2011-01-08 DIAGNOSIS — I10 Essential (primary) hypertension: Secondary | ICD-10-CM | POA: Insufficient documentation

## 2011-01-08 DIAGNOSIS — IMO0002 Reserved for concepts with insufficient information to code with codable children: Secondary | ICD-10-CM | POA: Insufficient documentation

## 2011-01-08 DIAGNOSIS — R0989 Other specified symptoms and signs involving the circulatory and respiratory systems: Secondary | ICD-10-CM

## 2011-01-08 DIAGNOSIS — R05 Cough: Secondary | ICD-10-CM

## 2011-01-08 DIAGNOSIS — E785 Hyperlipidemia, unspecified: Secondary | ICD-10-CM | POA: Insufficient documentation

## 2011-01-08 DIAGNOSIS — J069 Acute upper respiratory infection, unspecified: Secondary | ICD-10-CM | POA: Insufficient documentation

## 2011-01-08 DIAGNOSIS — R059 Cough, unspecified: Secondary | ICD-10-CM

## 2011-01-08 HISTORY — DX: Gastro-esophageal reflux disease without esophagitis: K21.9

## 2011-01-08 HISTORY — DX: Hyperlipidemia, unspecified: E78.5

## 2011-01-08 HISTORY — DX: Atherosclerotic heart disease of native coronary artery without angina pectoris: I25.10

## 2011-01-08 HISTORY — DX: Essential (primary) hypertension: I10

## 2011-01-08 MED ORDER — CYCLOBENZAPRINE HCL 10 MG PO TABS: 10.0000 mg | ORAL_TABLET | Freq: Three times a day (TID) | ORAL | Status: AC | PRN

## 2011-01-08 MED ORDER — BENZONATATE 200 MG PO CAPS: 200.0000 mg | ORAL_CAPSULE | Freq: Three times a day (TID) | ORAL | Status: AC | PRN

## 2011-01-08 MED ORDER — CYCLOBENZAPRINE HCL 10 MG PO TABS
10.0000 mg | ORAL_TABLET | Freq: Once | ORAL | Status: DC
  Filled 2011-01-08: qty 1

## 2011-01-08 MED ORDER — BENZONATATE 100 MG PO CAPS
200.0000 mg | ORAL_CAPSULE | Freq: Once | ORAL | Status: AC
  Administered 2011-01-08: 200 mg via ORAL
  Filled 2011-01-08: qty 2

## 2011-01-08 NOTE — ED Notes (Signed)
Pt amb to room 8 with quick steady gait in nad. Pt reports cough producing thick yellow sputum and congestion x 1 week, states "I also pulled my back out when I was trimming my toenails this morning..." c/o lbp on left side.

## 2011-01-08 NOTE — ED Provider Notes (Signed)
History     CSN: [id number] Arrival date & time: 01/08/2011  8:47 AM   First MD Initiated Contact with Patient 01/08/11 [phone]      Chief Complaint  Patient presents with  . Back Pain  . Cough  . Nasal Congestion    (Consider location/radiation/quality/duration/timing/severity/associated sxs/prior treatment) HPI Patient is a 75 year old male who presents today complaining of one week of cough and upper respiratory congestion. He denies any fevers. He did not have his flu shot this year. Patient has not taken any antibiotics for this. He denies any abdominal symptoms. Patient describes chest pain only when he coughs. His cough has been nonproductive. Patient also reports that he strained his back today when he bent over to cut his toenails. He's had back injuries in the past. He's placed icy hot on this and put on a back brace. He denies any urinary or fecal incontinence. He has no saddle anesthesia. He has no other high risk factors for serious bony pathology. There are no other associated or modifying factors. Past Medical History  Diagnosis Date  . CAD (coronary artery disease)   . Hyperlipemia   . Hypertension   . GERD (gastroesophageal reflux disease)     Past Surgical History  Procedure Date  . Appendectomy   . Joint replacement   . Tonsillectomy   . Cardiac surgery   . Hernia repair     History reviewed. No pertinent family history.  History  Substance Use Topics  . Smoking status: Never Smoker   . Smokeless tobacco: Not on file  . Alcohol Use: No      Review of Systems  Constitutional: Negative.   HENT: Positive for congestion.   Eyes: Negative.   Respiratory: Positive for cough.   Cardiovascular: Negative.   Gastrointestinal: Negative.   Genitourinary: Negative.   Musculoskeletal: Positive for back pain.  Neurological: Negative.   Hematological: Negative.   Psychiatric/Behavioral: Negative.   All other systems reviewed and are negative.    Allergies   Cranberry and Niacin  Home Medications   Current Outpatient Rx  Name Route Sig Dispense Refill  . ACETAMINOPHEN 500 MG PO TABS Oral Take 500 mg by mouth every 6 (six) hours as needed.      Marland Kitchen VITAMIN C-ROSE HIPS 1000 MG PO TABS Oral Take 1,000 mg by mouth daily.      . ASPIRIN 81 MG PO TABS Oral Take 81 mg by mouth daily.      . ATORVASTATIN CALCIUM 40 MG PO TABS Oral Take 40 mg by mouth daily.      Marland Kitchen CEFOL PO TABS Oral Take 1 tablet by mouth daily.      Marland Kitchen CALCIUM CARBONATE-VITAMIN D 250-125 MG-UNIT PO TABS Oral Take 1 tablet by mouth daily.      Marland Kitchen VITAMIN D 1000 UNITS PO TABS Oral Take 2,000 Units by mouth daily.      Marland Kitchen LOSARTAN POTASSIUM 25 MG PO TABS Oral Take 25 mg by mouth daily.      Marland Kitchen METOPROLOL TARTRATE 50 MG PO TABS Oral Take 50 mg by mouth 2 (two) times daily.      Marland Kitchen NITROGLYCERIN 0.6 MG SL SUBL Sublingual Place 0.6 mg under the tongue every 5 (five) minutes as needed.      Marland Kitchen RANITIDINE HCL 150 MG PO TABS Oral Take 150 mg by mouth 1 day or 1 dose.      Marland Kitchen BENZONATATE 200 MG PO CAPS Oral Take 1 capsule (200 mg total) by  mouth 3 (three) times daily as needed for cough. 20 capsule 0  . CYCLOBENZAPRINE HCL 10 MG PO TABS Oral Take 1 tablet (10 mg total) by mouth 3 (three) times daily as needed for muscle spasms. 20 tablet 0    BP 132/80  Pulse 85  Temp(Src) 98.4 F (36.9 C) (Oral)  Resp 20  SpO2 97%  Physical Exam  Nursing note and vitals reviewed. Constitutional: He is oriented to person, place, and time. He appears well-developed and well-nourished. No distress.  HENT:  Head: Normocephalic and atraumatic.       Nasal congestion  Eyes: Conjunctivae and EOM are normal. Pupils are equal, round, and reactive to light.  Neck: Normal range of motion.  Cardiovascular: Normal rate, regular rhythm and intact distal pulses.  Exam reveals no gallop and no friction rub.   No murmur heard. Pulmonary/Chest: Effort normal and breath sounds normal. No respiratory distress. He has no  wheezes. He has no rales.  Abdominal: Soft. Bowel sounds are normal. He exhibits no distension. There is no tenderness. There is no rebound and no guarding.  Musculoskeletal: Normal range of motion.  Neurological: He is alert and oriented to person, place, and time. No cranial nerve deficit. He exhibits normal muscle tone. Coordination normal.  Skin: Skin is warm and dry. No rash noted.  Psychiatric: He has a normal mood and affect.    ED Course  Procedures (including critical care time)  Labs Reviewed - No data to display Dg Chest 2 View  01/08/2011  *RADIOLOGY REPORT*  Clinical Data: Cough and congestion, hypertension and reflux  CHEST - 2 VIEW  Comparison: February 16, 2009  Findings: The cardiac silhouette, mediastinum, pulmonary vasculature are within normal limits.  Both lungs are clear. Pacemaker leads are stable in position and appearance over the right atrium and right ventricle. There is no acute bony abnormality.  IMPRESSION: Stable chest x-ray with no evidence of acute cardiac or pulmonary process.  Original Report Authenticated By: Brandon Melnick, M.D.     1. Back strain   2. URI (upper respiratory infection)       MDM  Patient was evaluated by myself. He was nontoxic appearing. Given patient's history of symptoms for one week chest x-ray was performed. This was unremarkable. Patient was offered Flexeril which has worked for him in the past for his back spasms. He declined this as he was driving today. He was discharged with a prescription for it. Patient also was most bothered by his cough. He was given a dose of Tessalon Perles here. He will be discharged with a prescription for this. The patient was discharged in good condition to home.        Cyndra Numbers, MD 01/08/11 [phone]

## 2011-04-29 ENCOUNTER — Emergency Department (HOSPITAL_BASED_OUTPATIENT_CLINIC_OR_DEPARTMENT_OTHER)
Admission: EM | Admit: 2011-04-29 | Discharge: 2011-04-29 | Disposition: A | Discharge status: Home / Self Care | Payer: Medicare PPO | Attending: Emergency Medicine | Admitting: Emergency Medicine

## 2011-04-29 ENCOUNTER — Encounter (HOSPITAL_BASED_OUTPATIENT_CLINIC_OR_DEPARTMENT_OTHER): Payer: Self-pay | Admitting: *Deleted

## 2011-04-29 DIAGNOSIS — K219 Gastro-esophageal reflux disease without esophagitis: Secondary | ICD-10-CM | POA: Insufficient documentation

## 2011-04-29 DIAGNOSIS — E785 Hyperlipidemia, unspecified: Secondary | ICD-10-CM | POA: Insufficient documentation

## 2011-04-29 DIAGNOSIS — S39012A Strain of muscle, fascia and tendon of lower back, initial encounter: Secondary | ICD-10-CM

## 2011-04-29 DIAGNOSIS — I10 Essential (primary) hypertension: Secondary | ICD-10-CM | POA: Insufficient documentation

## 2011-04-29 DIAGNOSIS — M549 Dorsalgia, unspecified: Secondary | ICD-10-CM | POA: Insufficient documentation

## 2011-04-29 DIAGNOSIS — I2510 Atherosclerotic heart disease of native coronary artery without angina pectoris: Secondary | ICD-10-CM | POA: Insufficient documentation

## 2011-04-29 MED ORDER — DIAZEPAM 5 MG PO TABS: 5.0000 mg | ORAL_TABLET | Freq: Two times a day (BID) | ORAL | Status: AC

## 2011-04-29 NOTE — Discharge Instructions (Signed)

## 2011-04-29 NOTE — ED Notes (Signed)
Pt amb to triage with quick steady gait  Smiling in nad. Pt reports he "bent over and threw his back out..." a few hours ago. Pt states he needs a muscle relaxer, has tried icy hot and brace with very little relief.

## 2011-04-29 NOTE — ED Provider Notes (Signed)
History     CSN: [id number]  Arrival date & time 04/29/11  1641   First MD Initiated Contact with Patient 04/29/11 1718      Chief Complaint  Patient presents with  . Back Pain    (Consider location/radiation/quality/duration/timing/severity/associated sxs/prior treatment) HPI Comments: Patient reports a history of prior back injury many years ago.  He notes that today he was bending over and feels that he "threw his back out".  Patient has bilateral lumbar pain in the paraspinal region.  No weakness or numbness in his legs.  No bladder or bowel incontinence.  Patient notes this is similar to prior episodes and muscle relaxants have helped him significantly.  He has tried icy hot and Tylenol without significant relief.  Patient is a 76 y.o. male presenting with back pain. The history is provided by the patient. No language interpreter was used.  Back Pain  This is a recurrent problem. The current episode started 6 to 12 hours ago. The problem occurs constantly. The problem has not changed since onset.Associated with: bending over. The pain is present in the lumbar spine. The quality of the pain is described as aching. The pain is moderate. The symptoms are aggravated by bending. Pertinent negatives include no chest pain, no fever, no headaches and no abdominal pain.    Past Medical History  Diagnosis Date  . CAD (coronary artery disease)   . Hyperlipemia   . Hypertension   . GERD (gastroesophageal reflux disease)     Past Surgical History  Procedure Date  . Appendectomy   . Joint replacement   . Tonsillectomy   . Cardiac surgery   . Hernia repair     History reviewed. No pertinent family history.  History  Substance Use Topics  . Smoking status: Never Smoker   . Smokeless tobacco: Not on file  . Alcohol Use: No      Review of Systems  Constitutional: Negative.  Negative for fever and chills.  HENT: Negative.   Eyes: Negative.  Negative for discharge and redness.    Respiratory: Negative.  Negative for cough and shortness of breath.   Cardiovascular: Negative.  Negative for chest pain.  Gastrointestinal: Negative.  Negative for nausea, vomiting and abdominal pain.  Genitourinary: Negative.  Negative for hematuria.  Musculoskeletal: Positive for back pain.  Skin: Negative.  Negative for color change and rash.  Neurological: Negative for syncope and headaches.  Hematological: Negative.  Negative for adenopathy.  Psychiatric/Behavioral: Negative.  Negative for confusion.  All other systems reviewed and are negative.    Allergies  Cranberry and Niacin  Home Medications   Current Outpatient Rx  Name Route Sig Dispense Refill  . ACETAMINOPHEN 500 MG PO TABS Oral Take 500 mg by mouth every 6 (six) hours as needed.      Marland Kitchen VITAMIN C-ROSE HIPS 1000 MG PO TABS Oral Take 1,000 mg by mouth daily.      . ASPIRIN 81 MG PO TABS Oral Take 81 mg by mouth daily.      . ATORVASTATIN CALCIUM 40 MG PO TABS Oral Take 40 mg by mouth daily.      Marland Kitchen CEFOL PO TABS Oral Take 1 tablet by mouth daily.      Marland Kitchen CALCIUM CARBONATE-VITAMIN D 250-125 MG-UNIT PO TABS Oral Take 1 tablet by mouth daily.      Marland Kitchen VITAMIN D 1000 UNITS PO TABS Oral Take 2,000 Units by mouth daily.      Marland Kitchen LOSARTAN POTASSIUM 25 MG PO  TABS Oral Take 25 mg by mouth daily.      Marland Kitchen METOPROLOL TARTRATE 50 MG PO TABS Oral Take 50 mg by mouth 2 (two) times daily.      Marland Kitchen NITROGLYCERIN 0.6 MG SL SUBL Sublingual Place 0.6 mg under the tongue every 5 (five) minutes as needed.      Marland Kitchen RANITIDINE HCL 150 MG PO TABS Oral Take 150 mg by mouth 1 day or 1 dose.        BP 109/79  Pulse 93  Temp(Src) 98 F (36.7 C) (Oral)  Resp 20  Ht 5\' 9"  (1.753 m)  Wt 190 lb (86.183 kg)  BMI 28.06 kg/m2  SpO2 95%  Physical Exam  Nursing note and vitals reviewed. Constitutional: He is oriented to person, place, and time. He appears well-developed and well-nourished.  Non-toxic appearance. He does not have a sickly appearance.   HENT:  Head: Normocephalic and atraumatic.  Eyes: Conjunctivae, EOM and lids are normal. Pupils are equal, round, and reactive to light.  Neck: Trachea normal, normal range of motion and full passive range of motion without pain. Neck supple.  Cardiovascular: Normal rate.   Pulmonary/Chest: Effort normal.  Abdominal: Soft. Normal appearance. He exhibits no distension. There is no tenderness. There is no rebound and no CVA tenderness.  Musculoskeletal: Normal range of motion.       No T-spine or L-spine tenderness on examination.  Patient has mild tenderness to palpation over his lumbar paraspinal regions bilaterally.  Patient can lift each leg off the bed with equal strength against resistance.  No difficulty with plantar or dorsiflexion of his ankles.  Sensation to light touch is intact in both legs.  Patient has a normal gait here in the emergency department.  Neurological: He is alert and oriented to person, place, and time. He has normal strength.  Skin: Skin is warm, dry and intact. No rash noted.  Psychiatric: He has a normal mood and affect. His behavior is normal. Judgment and thought content normal.    ED Course  Procedures (including critical care time)  Labs Reviewed - No data to display No results found.   No diagnosis found.    MDM  Patient with likely lumbar strain given that he has paraspinal tenderness. there Is no bony tenderness to suggest osteomyelitis or discitis.  No symptoms for infection such as fevers, chills or night sweats.  Patient has no neurologic deficit to suggest that he has acute spinal cord compression at this time.  Patient has been given precautions that he should seek reevaluation immediately if he does demonstrate any weakness, numbness or bladder: Continent.  Patient also knows that if he is not improving to followup with his primary care physician next week.        Nat Christen, MD 04/29/11 1728

## 2011-09-15 ENCOUNTER — Telehealth: Payer: Self-pay

## 2011-09-15 NOTE — Telephone Encounter (Signed)
4 days of rectal bleeding.  Monday according to the patient was a large amount.  His bleeding is with each BM.  He will come in and see Mike Gip PA tomorrow at 09/16/11

## 2011-09-16 ENCOUNTER — Ambulatory Visit (INDEPENDENT_AMBULATORY_CARE_PROVIDER_SITE_OTHER): Payer: Medicare PPO | Admitting: Physician Assistant

## 2011-09-16 ENCOUNTER — Encounter: Payer: Self-pay | Admitting: Physician Assistant

## 2011-09-16 VITALS — BP 134/68 | HR 60 | Ht 68.0 in | Wt 191.0 lb

## 2011-09-16 DIAGNOSIS — K5289 Other specified noninfective gastroenteritis and colitis: Secondary | ICD-10-CM

## 2011-09-16 DIAGNOSIS — K52832 Lymphocytic colitis: Secondary | ICD-10-CM

## 2011-09-16 DIAGNOSIS — K625 Hemorrhage of anus and rectum: Secondary | ICD-10-CM

## 2011-09-16 MED ORDER — PREDNISONE 5 MG PO TABS: 20.0000 mg | ORAL_TABLET | Freq: Every day | ORAL | Status: AC

## 2011-09-16 NOTE — Progress Notes (Signed)
Subjective:    Patient ID: Jose Ibarra, male    DOB: July 27, 1935, 76 y.o.   MRN: [id number]  HPI Jose Ibarra is a very nice 76 year old  male a known to Dr. Leone Payor who has history of hypertension, coronary artery disease and hyperlipidemia. He was treated for C. difficile colitis in 2011, and then underwent workup in January of 2012 for complaints of abdominal pain diarrhea and rectal bleeding and was found on colonoscopy to have a friable nodular mucosa in the terminal ileum and right colon with patchy erythema and loss of the normal vascular pattern in the transverse colon and left colon . Biopsies were consistent with a lymphocytic colitis. 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 He was treated with a course of prednisone and his symptoms resolved We have not seen him since that time. He says he has been doing very well. He does have physical within the past week and says that everything checked out and his labs were all unremarkable. Labs are reviewed and he had a CBC showing white count 6.1 hemoglobin 14.7 hematocrit 44.9 CV of 90 ,CMETwas completely unremarkable . Patient comes in today because he had onset about 4 days ago with a small amount of blood mixed in with his bowel movement. He says he really has not had any abdominal pain or cramping but has had some very minimal, we'll discomfort over the past week. He has had no nausea or vomiting, his appetite has been good, no fever or chills. His bowel movements are normal but over the past 3 days he has continued to see bright red blood mixed in with each bowel movement. He has been taking a baby aspirin daily and has also been taking 2 Aleve every day over the past 6 months because of arthritic symptoms.    Review of Systems  Constitutional: Negative.   HENT: Negative.   Eyes: Negative.   Respiratory: Negative.   Cardiovascular: Negative.   Gastrointestinal: Positive for  blood in stool.  Genitourinary: Negative.   Musculoskeletal: Positive for arthralgias.  Neurological: Negative.   Hematological: Negative.   Psychiatric/Behavioral: Negative.    Outpatient Prescriptions Prior to Visit  Medication Sig Dispense Refill  . aspirin 81 MG tablet Take 81 mg by mouth daily.        Marland Kitchen atorvastatin (LIPITOR) 40 MG tablet Take 40 mg by mouth daily.        . calcium-vitamin D (OSCAL WITH D) 250-125 MG-UNIT per tablet Take 1 tablet by mouth 2 (two) times daily.       . cholecalciferol (VITAMIN D) 1000 UNITS tablet Take 2,000 Units by mouth daily.        Marland Kitchen losartan (COZAAR) 25 MG tablet Take 25 mg by mouth daily.        . metoprolol (LOPRESSOR) 50 MG tablet Take 50 mg by mouth 2 (two) times daily.        . nitroGLYCERIN (NITROSTAT) 0.6 MG SL tablet Place 0.6 mg under the tongue every 5 (five) minutes as needed.        . ranitidine (ZANTAC) 150 MG tablet Take 150 mg by mouth 1 day or 1 dose.        Marland Kitchen acetaminophen (TYLENOL) 500 MG tablet Take 500 mg by mouth every 6 (six) hours as needed.        . Ascorbic Acid (VITAMIN C WITH ROSE HIPS) 1000 MG tablet Take 1,000 mg by mouth daily.        Marland Kitchen  B Complex-C-Folic Acid (MULTIVITAMIN, STRESS FORMULA) tablet Take 1 tablet by mouth daily.         Allergies  Allergen Reactions  . Cranberry   . Niacin        Patient Active Problem List  Diagnosis  . CLOSTRIDIUM DIFFICILE COLITIS  . HYPERLIPIDEMIA  . HYPERTENSION  . CORONARY ARTERY DISEASE  . OSTEOARTHRITIS  . GASTRITIS, ACUTE  . COLITIS  . Lymphocytic colitis   History  Substance Use Topics  . Smoking status: Never Smoker   . Smokeless tobacco: Never Used  . Alcohol Use: No    Objective:   Physical Exam Well-developed older African American male in no acute distress, accompanied by his wife. Blood pressure 134/68, pulse 60 height 5 foot 8 weight 191. HEENT; nontraumatic normocephalic EOMI PERRLA sclera anicteric,Neck; Supple no JVD, Cardiovascular; regular rate  and rhythm with S1-S2 no murmur or gallop, Pulmonary; clear bilaterally, Abdomen; soft, basically nontender bowel sounds are active there is no palpable mass or hepatosplenomegaly, Rectal; exam not done, Extremities; no clubbing, cyanosis, or edema skin warm and dry, Psych; mood and affect normal and appropriate        Assessment & Plan:  #26  76 year old male with a four-day history of hematochezia with bright red blood mixed in with a normal-appearing bowel movement. He does have history of lymphocytic colitis diagnosed in January of 2012 and steroid responsive. I suspect he has had a reactivation of his colitis, trigger is not clear but would consider NSAID use. #2 history of C. difficile colitis #3 coronary artery disease #4 hypertension #5 hyperlipidemia  Plan; stop Aleve Start prednisone 20 mg by mouth every morning and plan office followup with Dr. Leone Payor in 2-3 weeks . No labs today as he just had a normal CBC last week Patient is advised that should his symptoms progress it should call back in the interim for advice

## 2011-09-16 NOTE — Progress Notes (Signed)
i agree with the plan outlined in this note 

## 2011-09-16 NOTE — Patient Instructions (Addendum)
We have sent the following medications to your pharmacy for you to pick up at your convenience: Prednisone 20 mg. Take 1 tablet daily.  Please stop taking Aleve until further directed.    You have a follow up with Dr. Leone Payor on 10/14/2011 @ 11:30am  Please call our office if you are not feeling better in a week

## 2011-10-14 ENCOUNTER — Ambulatory Visit (INDEPENDENT_AMBULATORY_CARE_PROVIDER_SITE_OTHER): Payer: Medicare PPO | Admitting: Internal Medicine

## 2011-10-14 ENCOUNTER — Encounter: Payer: Self-pay | Admitting: Internal Medicine

## 2011-10-14 VITALS — BP 102/68 | HR 84 | Ht 68.0 in | Wt 192.2 lb

## 2011-10-14 DIAGNOSIS — K5289 Other specified noninfective gastroenteritis and colitis: Secondary | ICD-10-CM

## 2011-10-14 DIAGNOSIS — K219 Gastro-esophageal reflux disease without esophagitis: Secondary | ICD-10-CM

## 2011-10-14 DIAGNOSIS — K52832 Lymphocytic colitis: Secondary | ICD-10-CM

## 2011-10-14 MED ORDER — HYDROCORTISONE 2.5 % RE CREA: TOPICAL_CREAM | Freq: Two times a day (BID) | RECTAL | Status: AC | PRN

## 2011-10-14 MED ORDER — RANITIDINE HCL 150 MG PO TABS: 150.0000 mg | ORAL_TABLET | ORAL | Status: DC

## 2011-10-14 MED ORDER — OMEPRAZOLE MAGNESIUM 20 MG PO TBEC: 20.0000 mg | DELAYED_RELEASE_TABLET | Freq: Every day | ORAL | Status: DC

## 2011-10-14 NOTE — Progress Notes (Signed)
Subjective:    Patient ID: Jose Ibarra, male    DOB: 11/19/1935, 76 y.o.   MRN: [id number]  HPI The patient presents with complaints of heartburn. He was with diarrhea approximately one month ago, it was thought he had a flare of his lymphocytic colitis. Prednisone was prescribed and he took that and his symptoms resolved and he has finished his course. He is now having heartburn and dyspepsia problems. Normally he is able to control this with intermittent Zantac or antacids but over the past couple of weeks it's been a daily phenomenon. There is no dysphagia or weight loss or bleeding. He does not have odynophagia.  He otherwise feels well. Preparing to take his wife on a trip with the lady's from church to [facility].  Allergies  Allergen Reactions  . Cranberry   . Niacin    Outpatient Prescriptions Prior to Visit  Medication Sig Dispense Refill  . Ascorbic Acid (VITAMIN C) 1000 MG tablet Take 1,000 mg by mouth daily.      Marland Kitchen aspirin 81 MG tablet Take 81 mg by mouth daily.        Marland Kitchen atorvastatin (LIPITOR) 40 MG tablet Take 40 mg by mouth daily.        . calcium-vitamin D (OSCAL WITH D) 250-125 MG-UNIT per tablet Take 1 tablet by mouth 2 (two) times daily.       . cholecalciferol (VITAMIN D) 1000 UNITS tablet Take 2,000 Units by mouth daily.        Marland Kitchen losartan (COZAAR) 25 MG tablet Take 25 mg by mouth daily.        . metoprolol (LOPRESSOR) 50 MG tablet Take 50 mg by mouth 2 (two) times daily.        . Multiple Vitamins-Minerals (CENTRUM PO) Take 1 capsule by mouth daily.      . Naproxen Sodium (ALEVE) 220 MG CAPS Take by mouth as needed.      . nitroGLYCERIN (NITROSTAT) 0.6 MG SL tablet Place 0.6 mg under the tongue every 5 (five) minutes as needed.        . triamcinolone ointment (KENALOG) 0.1 % Apply topically as directed.      . vitamin B-12 (CYANOCOBALAMIN) 1000 MCG tablet Take 1,000 mcg by mouth daily.      . ranitidine (ZANTAC) 150 MG tablet Take 150 mg by mouth 1 day or 1 dose.          Past Medical History  Diagnosis Date  . CAD (coronary artery disease)   . Hyperlipemia   . Hypertension   . GERD (gastroesophageal reflux disease)   . Myocardial infarction 2008   . Lymphocytic colitis   . Lymphocytic gastritis   . C. difficile colitis 2011    relapsed x 1  . Peptic ulcer disease   . Hip dislocation, right    Past Surgical History  Procedure Date  . Appendectomy   . Tonsillectomy   . Coronary angioplasty with stent placement 2008, 2009    DES x 2  . Inguinal hernia repair     bilateral  . Pacemaker insertion 2003  . Total hip arthroplasty     Bilateral   . Tonsillectomy   . Hemorrhoid surgery   . Femoral hernia repair     bilateral  . Total hip revision 2011    left     Review of Systems As above    Objective:   Physical Exam WDWN NAD     Assessment & Plan:   1.  GERD (gastroesophageal reflux disease)   2. Lymphocytic colitis    1. Prilosec OTC 20 mg daily for 2 months, replacing the Zantac that could be added on as needed. He is to stop after 2 months and look for recurrent symptoms. He may need a daily PPI though in the past we have stopped his lansoprazole because I thought that might have been related to his lymphocytic colitis and gastritis. He knows to call back if he has persistent problems. 2. His lymphocytic colitis is in remission at this time. Followup as needed for this.  CC: Aura Dials, MD

## 2011-10-14 NOTE — Patient Instructions (Signed)
Take Prilosec OTC 20 mg 1 30 minutes before breakfast each day for 2 months then stop. Then you can go back to Zantac.  If that is not helping restart the Prilosec OTC or call me back.  Thank you for choosing me and [facility].  Iva Boop, MD, Clementeen Graham

## 2012-02-08 ENCOUNTER — Emergency Department (HOSPITAL_BASED_OUTPATIENT_CLINIC_OR_DEPARTMENT_OTHER)
Admission: EM | Admit: 2012-02-08 | Discharge: 2012-02-08 | Disposition: A | Discharge status: Home / Self Care | Payer: Medicare PPO | Attending: Emergency Medicine | Admitting: Emergency Medicine

## 2012-02-08 ENCOUNTER — Emergency Department (HOSPITAL_BASED_OUTPATIENT_CLINIC_OR_DEPARTMENT_OTHER): Payer: Medicare PPO

## 2012-02-08 ENCOUNTER — Encounter (HOSPITAL_BASED_OUTPATIENT_CLINIC_OR_DEPARTMENT_OTHER): Payer: Self-pay

## 2012-02-08 DIAGNOSIS — Z7982 Long term (current) use of aspirin: Secondary | ICD-10-CM | POA: Insufficient documentation

## 2012-02-08 DIAGNOSIS — E785 Hyperlipidemia, unspecified: Secondary | ICD-10-CM | POA: Insufficient documentation

## 2012-02-08 DIAGNOSIS — Z79899 Other long term (current) drug therapy: Secondary | ICD-10-CM | POA: Insufficient documentation

## 2012-02-08 DIAGNOSIS — J3489 Other specified disorders of nose and nasal sinuses: Secondary | ICD-10-CM | POA: Insufficient documentation

## 2012-02-08 DIAGNOSIS — Z8711 Personal history of peptic ulcer disease: Secondary | ICD-10-CM | POA: Insufficient documentation

## 2012-02-08 DIAGNOSIS — Z8781 Personal history of (healed) traumatic fracture: Secondary | ICD-10-CM | POA: Insufficient documentation

## 2012-02-08 DIAGNOSIS — J069 Acute upper respiratory infection, unspecified: Secondary | ICD-10-CM | POA: Insufficient documentation

## 2012-02-08 DIAGNOSIS — Z8719 Personal history of other diseases of the digestive system: Secondary | ICD-10-CM | POA: Insufficient documentation

## 2012-02-08 DIAGNOSIS — Z950 Presence of cardiac pacemaker: Secondary | ICD-10-CM | POA: Insufficient documentation

## 2012-02-08 DIAGNOSIS — I252 Old myocardial infarction: Secondary | ICD-10-CM | POA: Insufficient documentation

## 2012-02-08 DIAGNOSIS — Z9861 Coronary angioplasty status: Secondary | ICD-10-CM | POA: Insufficient documentation

## 2012-02-08 DIAGNOSIS — I2510 Atherosclerotic heart disease of native coronary artery without angina pectoris: Secondary | ICD-10-CM | POA: Insufficient documentation

## 2012-02-08 DIAGNOSIS — R51 Headache: Secondary | ICD-10-CM | POA: Insufficient documentation

## 2012-02-08 DIAGNOSIS — I10 Essential (primary) hypertension: Secondary | ICD-10-CM | POA: Insufficient documentation

## 2012-02-08 DIAGNOSIS — J40 Bronchitis, not specified as acute or chronic: Secondary | ICD-10-CM

## 2012-02-08 DIAGNOSIS — K219 Gastro-esophageal reflux disease without esophagitis: Secondary | ICD-10-CM | POA: Insufficient documentation

## 2012-02-08 MED ORDER — AZITHROMYCIN 250 MG PO TABS: 250.0000 mg | ORAL_TABLET | Freq: Every day | ORAL | Status: DC

## 2012-02-08 NOTE — ED Notes (Signed)
Pt reports onset of headache, cough and cold symptoms x 1 week unrelieved after taking OTC medications.

## 2012-02-08 NOTE — ED Notes (Signed)
MD at bedside. 

## 2012-02-08 NOTE — ED Notes (Signed)
Patient transported to X-ray 

## 2012-02-08 NOTE — ED Notes (Signed)
Pt reports cough, headache and cold symptoms that started 1 week ago unrelieved after taking OTC medications.

## 2012-02-08 NOTE — ED Provider Notes (Addendum)
History     CSN: [id number]  Arrival date & time 02/08/12  1034   First MD Initiated Contact with Patient 02/08/12 1056      Chief Complaint  Patient presents with  . Cough  . Headache  . URI    (Consider location/radiation/quality/duration/timing/severity/associated sxs/prior treatment) Patient is a 77 y.o. male presenting with cough, headaches, and URI. The history is provided by the patient.  Cough This is a new problem. The current episode started more than 1 week ago. The problem occurs constantly. The problem has been gradually worsening. The cough is non-productive. There has been no fever. Associated symptoms include headaches and rhinorrhea. Pertinent negatives include no sore throat, no shortness of breath and no wheezing. Associated symptoms comments: Chest congestion. He has tried decongestants for the symptoms. The treatment provided no relief. He is not a smoker. His past medical history does not include COPD or asthma.  Headache  Pertinent negatives include no shortness of breath.  URI The primary symptoms include headaches and cough. Primary symptoms do not include sore throat or wheezing.  Symptoms associated with the illness include rhinorrhea.    Past Medical History  Diagnosis Date  . CAD (coronary artery disease)   . Hyperlipemia   . Hypertension   . GERD (gastroesophageal reflux disease)   . Myocardial infarction 2008   . Lymphocytic colitis   . Lymphocytic gastritis   . C. difficile colitis 2011    relapsed x 1  . Peptic ulcer disease   . Hip dislocation, right     Past Surgical History  Procedure Date  . Appendectomy   . Tonsillectomy   . Coronary angioplasty with stent placement 2008, 2009    DES x 2  . Inguinal hernia repair     bilateral  . Pacemaker insertion 2003  . Total hip arthroplasty     Bilateral   . Tonsillectomy   . Hemorrhoid surgery   . Femoral hernia repair     bilateral  . Total hip revision 2011    left    Family  History  Problem Relation Age of Onset  . Colon cancer Neg Hx   . Breast cancer Sister     History  Substance Use Topics  . Smoking status: Never Smoker   . Smokeless tobacco: Never Used  . Alcohol Use: No      Review of Systems  HENT: Positive for rhinorrhea. Negative for sore throat.   Respiratory: Positive for cough. Negative for shortness of breath and wheezing.   Neurological: Positive for headaches.  All other systems reviewed and are negative.    Allergies  Cranberry and Niacin  Home Medications   Current Outpatient Rx  Name  Route  Sig  Dispense  Refill  . VITAMIN C 1000 MG PO TABS   Oral   Take 1,000 mg by mouth daily.         . ASPIRIN 81 MG PO TABS   Oral   Take 81 mg by mouth daily.           . ATORVASTATIN CALCIUM 40 MG PO TABS   Oral   Take 40 mg by mouth daily.           Marland Kitchen CALCIUM CARBONATE-VITAMIN D 250-125 MG-UNIT PO TABS   Oral   Take 1 tablet by mouth 2 (two) times daily.          Marland Kitchen VITAMIN D 1000 UNITS PO TABS   Oral   Take 2,000 Units  by mouth daily.           Marland Kitchen LOSARTAN POTASSIUM 25 MG PO TABS   Oral   Take 25 mg by mouth daily.           Marland Kitchen METOPROLOL TARTRATE 50 MG PO TABS   Oral   Take 50 mg by mouth 2 (two) times daily.           . CENTRUM PO   Oral   Take 1 capsule by mouth daily.         Marland Kitchen NAPROXEN SODIUM 220 MG PO CAPS   Oral   Take by mouth as needed.         Marland Kitchen NITROGLYCERIN 0.6 MG SL SUBL   Sublingual   Place 0.6 mg under the tongue every 5 (five) minutes as needed.           Marland Kitchen OMEPRAZOLE MAGNESIUM 20 MG PO TBEC   Oral   Take 1 tablet (20 mg total) by mouth daily. Samples # 48 given lot # [id number] exp 2/14   48 tablet   0   . RANITIDINE HCL 150 MG PO TABS   Oral   Take 1 tablet (150 mg total) by mouth 1 day or 1 dose. STOP TAKING - AFTER PRILOSEC STOPPED IN November MAY RESTART         . TRIAMCINOLONE ACETONIDE 0.1 % EX OINT   Topical   Apply topically as directed.         Marland Kitchen  VITAMIN B-12 1000 MCG PO TABS   Oral   Take 1,000 mcg by mouth daily.           BP 119/76  Pulse 88  Temp 98.5 F (36.9 C) (Oral)  Resp 16  SpO2 98%  Physical Exam  Nursing note and vitals reviewed. Constitutional: He is oriented to person, place, and time. He appears well-developed and well-nourished. No distress.  HENT:  Head: Normocephalic and atraumatic.  Right Ear: Tympanic membrane and ear canal normal.  Left Ear: Tympanic membrane and ear canal normal.  Mouth/Throat: Oropharynx is clear and moist and mucous membranes are normal.  Eyes: Conjunctivae normal and EOM are normal. Pupils are equal, round, and reactive to light.  Neck: Normal range of motion. Neck supple.  Cardiovascular: Normal rate, regular rhythm and intact distal pulses.   No murmur heard. Pulmonary/Chest: Effort normal and breath sounds normal. No respiratory distress. He has no wheezes. He has no rales.  Abdominal: Soft. He exhibits no distension. There is no tenderness. There is no rebound and no guarding.  Musculoskeletal: Normal range of motion. He exhibits no edema and no tenderness.  Neurological: He is alert and oriented to person, place, and time.  Skin: Skin is warm and dry. No rash noted. No erythema.  Psychiatric: He has a normal mood and affect. His behavior is normal.    ED Course  Procedures (including critical care time)  Labs Reviewed - No data to display Dg Chest 2 View  02/08/2012  *RADIOLOGY REPORT*  Clinical Data: Cough.  CHEST - 2 VIEW  Comparison: Chest x-ray 01/08/2011.  Findings: Lung volumes are normal.  No consolidative airspace disease.  No pleural effusions.  No pneumothorax.  No pulmonary nodule or mass noted.  Pulmonary vasculature and the cardiomediastinal silhouette are within normal limits.  Left-sided pacemaker device in place with lead tips projecting over the expected location of the right atrium and right ventricular apex.  IMPRESSION: 1. No radiographic evidence of  acute cardiopulmonary disease.   Original Report Authenticated By: Trudie Reed, M.D.      1. Bronchitis       MDM   Pt with symptoms consistent with viral URI.  Well appearing here.  No signs of breathing difficulty  No signs of pharyngitis, otitis or abnormal abdominal findings.   CXR wnl and pt to return with any further problems.         Gwyneth Sprout, MD 02/08/12 1144  Gwyneth Sprout, MD 02/08/12 1145

## 2013-10-06 ENCOUNTER — Encounter: Payer: Self-pay | Admitting: Internal Medicine

## 2015-11-03 HISTORY — PX: OTHER SURGICAL HISTORY: SHX169

## 2016-04-03 ENCOUNTER — Encounter (INDEPENDENT_AMBULATORY_CARE_PROVIDER_SITE_OTHER): Payer: Self-pay

## 2016-04-03 ENCOUNTER — Encounter: Payer: Self-pay | Admitting: Physician Assistant

## 2016-04-03 ENCOUNTER — Ambulatory Visit (INDEPENDENT_AMBULATORY_CARE_PROVIDER_SITE_OTHER): Payer: Medicare HMO | Admitting: Physician Assistant

## 2016-04-03 VITALS — BP 106/62 | HR 78 | Ht 68.0 in | Wt 179.1 lb

## 2016-04-03 DIAGNOSIS — R1012 Left upper quadrant pain: Secondary | ICD-10-CM

## 2016-04-03 DIAGNOSIS — K219 Gastro-esophageal reflux disease without esophagitis: Secondary | ICD-10-CM | POA: Diagnosis not present

## 2016-04-03 DIAGNOSIS — Z8711 Personal history of peptic ulcer disease: Secondary | ICD-10-CM

## 2016-04-03 MED ORDER — OMEPRAZOLE 40 MG PO CPDR: 40.0000 mg | DELAYED_RELEASE_CAPSULE | Freq: Every day | ORAL | 1 refills | Status: DC

## 2016-04-03 NOTE — Progress Notes (Addendum)
Chief Complaint: Left upper quadrant pain  HPI:   [name] is an 81 year old African-American male with a past medical history of CAD, GERD, hyperlipidemia, hypertension, lymphocytic colitis and peptic ulcer disease, who presents to clinic today with a complaint of left upper quadrant pain.    Patient has previously followed with Dr. Leone Payor but has not been seen in office since 10/14/11. Patient's last EGD was completed 02/28/10 with findings of moderate gastritis in the antrum and diverticulum in the pyloric channel and an otherwise normal exam.   Today, the patient presents to clinic accompanied by his wife who does have dementia, he tells me that since January of this year he has been noticing a left upper quadrant pain which only occurs after eating certain types of food. Patient tells me that immediately after swallowing his de-caf coffee he starts with a left upper quadrant pain, he also feels this after eating mandarin oranges and after salads with balsamic vinegar dressing. Patient notes that this sometimes increases to a 9/10 and then will ease away throughout the day until he eats something else that aggravates it. Patient is currently using Omeprazole 20 mg once daily in the morning as well as a Zantac 150 mg once daily in the morning. He tells me he is nervous as he has had ulcer disease before and does not want to have this again. Patient does tell me that this is more of an irritation and denies burning or sharpness. He denies use of NSAIDs. He does tell me he is somewhat more stressed as he is taking care of his wife and sometimes days can be difficult.   Patient denies fever, chills, blood in the stool, melena, change in bowel habits, weight loss, fatigue, anorexia, nausea, vomiting, heartburn, reflux, dysphagia or symptoms that awaken him at night.  Past Medical History:  Diagnosis Date  . C. difficile colitis 2011   relapsed x 1  . CAD (coronary artery disease)   . GERD  (gastroesophageal reflux disease)   . Hip dislocation, right (HCC)   . Hyperlipemia   . Hypertension   . Lymphocytic colitis   . Lymphocytic gastritis   . Myocardial infarction 2008   . Peptic ulcer disease     Past Surgical History:  Procedure Laterality Date  . APPENDECTOMY    . cataract surgery  11/2015  . CORONARY ANGIOPLASTY WITH STENT PLACEMENT  2008, 2009   DES x 2  . FEMORAL HERNIA REPAIR     bilateral  . HEMORRHOID SURGERY    . INGUINAL HERNIA REPAIR     bilateral  . PACEMAKER INSERTION  2003  . TONSILLECTOMY    . TONSILLECTOMY    . TOTAL HIP ARTHROPLASTY     Bilateral   . TOTAL HIP REVISION  2011   left    Current Outpatient Prescriptions  Medication Sig Dispense Refill  . acetaminophen (TYLENOL) 650 MG CR tablet Take 1,300 mg by mouth every 8 (eight) hours as needed for pain.    . Ascorbic Acid (VITAMIN C) 1000 MG tablet Take 1,000 mg by mouth daily.    Marland Kitchen aspirin 81 MG tablet Take 81 mg by mouth daily.      Marland Kitchen atorvastatin (LIPITOR) 40 MG tablet Take 40 mg by mouth daily.      . calcium-vitamin D (OSCAL WITH D) 250-125 MG-UNIT per tablet Take 1 tablet by mouth 2 (two) times daily.     Marland Kitchen losartan (COZAAR) 25 MG tablet Take 25 mg by mouth  daily.      . metoprolol (LOPRESSOR) 50 MG tablet Take 50 mg by mouth 2 (two) times daily.      . Multiple Vitamins-Minerals (CENTRUM PO) Take 1 capsule by mouth daily.    . nitroGLYCERIN (NITROSTAT) 0.6 MG SL tablet Place 0.6 mg under the tongue every 5 (five) minutes as needed.      . ranitidine (ZANTAC) 150 MG tablet Take 1 tablet (150 mg total) by mouth 1 day or 1 dose. STOP TAKING - AFTER PRILOSEC STOPPED IN November MAY RESTART (Patient taking differently: Take 150 mg by mouth 1 day or 1 dose. )    . vitamin B-12 (CYANOCOBALAMIN) 1000 MCG tablet Take 1,000 mcg by mouth daily.    Marland Kitchen. omeprazole (PRILOSEC OTC) 20 MG tablet Take 1 tablet (20 mg total) by mouth daily. Samples # 48 given lot # [id number] exp 2/14 (Patient taking  differently: Take 20 mg by mouth as needed. Samples # 48 given lot # [id number] exp 2/14) 48 tablet 0   No current facility-administered medications for this visit.     Allergies as of 04/03/2016 - Review Complete 04/03/2016  Allergen Reaction Noted  . Cranberry  01/08/2011  . Niacin      Family History  Problem Relation Age of Onset  . Breast cancer Sister   . Colon cancer Neg Hx     Social History   Social History  . Marital status: Married    Spouse name: N/A  . Number of children: 2  . Years of education: N/A   Occupational History  . retired    Social History Main Topics  . Smoking status: Never Smoker  . Smokeless tobacco: Never Used  . Alcohol use No  . Drug use: No  . Sexual activity: Not on file   Other Topics Concern  . 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    Review of Systems:    Constitutional: No weight loss, fever or chills Skin: No rash  Cardiovascular: No chest pain Respiratory: No SOB Gastrointestinal: See HPI and otherwise negative Genitourinary: No dysuria  Neurological: No headache Musculoskeletal: No new muscle or joint pain Hematologic: No bleeding  Psychiatric: No history of depression or anxiety   Physical Exam:  Vital signs: BP 106/62   Pulse 78   Ht 5\' 8"  (1.727 m)   Wt 179 lb 2 oz (81.3 kg)   BMI 27.24 kg/m   Constitutional:   Pleasant African American male appears to be in NAD, Well developed, Well nourished, alert and cooperative Head:  Normocephalic and atraumatic. Eyes:   PEERL, EOMI. No icterus. Conjunctiva pink. Ears:  Normal auditory acuity. Neck:  Supple Throat: Oral cavity and pharynx without inflammation, swelling or lesion.  Respiratory: Respirations even and unlabored. Lungs clear to auscultation bilaterally.   No wheezes, crackles, or rhonchi.  Cardiovascular: Normal S1, S2. No MRG. Regular rate and rhythm. No peripheral edema, cyanosis or pallor.    Gastrointestinal:  Soft, nondistended, mild LUQ tenderness. No rebound or guarding. Normal bowel sounds. No appreciable masses or hepatomegaly. Rectal:  Not performed.  Msk:  Symmetrical without gross deformities. Without edema, no deformity or joint abnormality.  Neurologic:  Alert and  oriented x4;  grossly normal neurologically.  Skin:   Dry and intact without significant lesions or rashes. Psychiatric: Demonstrates good judgement and reason without abnormal affect or behaviors.  No recent labs or imaging.  Assessment: 1. Left upper  quadrant pain: Immediately after eating certain foods which seem acidic, such as coffee, oranges or balsamic vinegar dressing, no melena; likely related to known gastritis 2. GERD: Long history, patient denies overt reflux or heartburn symptoms on Omeprazole 20 mg daily and Zantac 150 mg daily 3. History of peptic ulcer disease  Plan: 1. Increased patient's Omeprazole to 40 mg daily, 30-60 minutes before eating or drinking in the morning and instructed patient to use his Zantac 150 mg at night before going to bed. Provided #90 with 1 refill and sent to mail in pharmacy 2. Reviewed antireflux diet and lifestyle modifications. Encouraged patient to decrease intake of things which he knows irritate his stomach 3. Patient to follow in clinic in 3-4 months or sooner if necessary. If he has an increase or worsening of symptoms before then he should call our office  Hyacinth Meeker, PA-C [facility] 04/03/2016, 10:57 AM  Cc: Tracey Harries, MD   Agree with Ms. Lemmon's evaluation and management. Iva Boop, MD, [facility]

## 2016-04-03 NOTE — Patient Instructions (Signed)
We have sent the following medications to your pharmacy for you to pick up at your convenience: Omeprazole 40 mg daily every morning 30-60 mins before breakfast.   Take Zantac 150 mg at bedtime.

## 2016-04-14 ENCOUNTER — Telehealth: Payer: Self-pay | Admitting: Physician Assistant

## 2016-04-15 NOTE — Telephone Encounter (Signed)
Pt has continued bloating after eating, pt was advised to begin prilosec twice daily and zantac at bedtime.  If no better in 1 week call back.  Pt agreed

## 2016-04-21 ENCOUNTER — Ambulatory Visit: Payer: Medicare PPO | Admitting: Internal Medicine

## 2016-04-22 ENCOUNTER — Telehealth: Payer: Self-pay | Admitting: Physician Assistant

## 2016-04-22 MED ORDER — RANITIDINE HCL 150 MG PO TABS: 150.0000 mg | ORAL_TABLET | Freq: Two times a day (BID) | ORAL | 1 refills | Status: DC

## 2016-04-22 NOTE — Telephone Encounter (Signed)
No improvement in his abdominal pain and reflux with zantac 150 mg q HS as well as 40 mg omeprazole BID.  Please advise

## 2016-04-22 NOTE — Telephone Encounter (Signed)
He can try taking his Zantac 150mg  BID. If symptoms continue will need to discuss possible EGD for further eval. JLL

## 2016-04-22 NOTE — Telephone Encounter (Signed)
Patient notified of the recommendations New rx sent 

## 2016-05-04 ENCOUNTER — Telehealth: Payer: Self-pay | Admitting: Physician Assistant

## 2016-05-04 NOTE — Telephone Encounter (Signed)
Left message on machine to call back  

## 2016-05-08 NOTE — Telephone Encounter (Signed)
Pt takes 150 mg zantac at bedtime and omeprazole 30 min prior to breakfast and 30 min prior to dinner.  Continues to have bloating and abd discomfort.  Per last phone note pt may need EGD.  Please advise.

## 2016-05-11 NOTE — Telephone Encounter (Signed)
Pt has been scheduled for egd and previsit. He will call with any concerns.  He is NOT taking plavix

## 2016-05-11 NOTE — Telephone Encounter (Signed)
Yes, can go ahead and schedule EGD if continuing with symptoms. Leone Payor. Thanks-JLL

## 2016-05-11 NOTE — Telephone Encounter (Signed)
Dr Leone Payor I spoke with the pt and he states he had to have cardiac clearance prior to his last procedure.  He is seeing his cardiologist on Thursday and will have the note faxed to [place].  Please advise

## 2016-05-11 NOTE — Telephone Encounter (Signed)
OK to do that but do not think necessary -  I think the last time was to hold Plavix  Did not see that on this list and even if was would be ok to do EGD while still taking it

## 2016-05-27 ENCOUNTER — Ambulatory Visit (AMBULATORY_SURGERY_CENTER): Payer: Self-pay

## 2016-05-27 ENCOUNTER — Encounter: Payer: Self-pay | Admitting: Internal Medicine

## 2016-05-27 VITALS — Ht 68.0 in | Wt 177.0 lb

## 2016-05-27 DIAGNOSIS — R140 Abdominal distension (gaseous): Secondary | ICD-10-CM

## 2016-05-27 NOTE — Progress Notes (Signed)
No allergies to eggs or soy No past problems with anesthesia No diet meds No home oxygen  Declined emmi 

## 2016-06-03 ENCOUNTER — Ambulatory Visit (AMBULATORY_SURGERY_CENTER): Payer: Medicare HMO | Admitting: Internal Medicine

## 2016-06-03 ENCOUNTER — Encounter: Payer: Self-pay | Admitting: Internal Medicine

## 2016-06-03 VITALS — BP 113/60 | HR 62 | Temp 97.0°F | Resp 12 | Ht 68.0 in | Wt 177.0 lb

## 2016-06-03 DIAGNOSIS — K253 Acute gastric ulcer without hemorrhage or perforation: Secondary | ICD-10-CM

## 2016-06-03 DIAGNOSIS — K2970 Gastritis, unspecified, without bleeding: Secondary | ICD-10-CM | POA: Diagnosis not present

## 2016-06-03 DIAGNOSIS — K2990 Gastroduodenitis, unspecified, without bleeding: Secondary | ICD-10-CM

## 2016-06-03 DIAGNOSIS — R1012 Left upper quadrant pain: Secondary | ICD-10-CM | POA: Diagnosis not present

## 2016-06-03 DIAGNOSIS — K2950 Unspecified chronic gastritis without bleeding: Secondary | ICD-10-CM | POA: Diagnosis not present

## 2016-06-03 MED ORDER — PANTOPRAZOLE SODIUM 40 MG PO TBEC: 40.0000 mg | DELAYED_RELEASE_TABLET | Freq: Every day | ORAL | 3 refills | Status: DC

## 2016-06-03 MED ORDER — SODIUM CHLORIDE 0.9 % IV SOLN: 500.0000 mL | INTRAVENOUS | Status: DC

## 2016-06-03 NOTE — Progress Notes (Signed)
Pt's states no medical or surgical changes since previsit or office visit. 

## 2016-06-03 NOTE — Progress Notes (Signed)
Report to PACU, RN, vss, BBS= Clear.  

## 2016-06-03 NOTE — Op Note (Signed)
[facility] Patient Name: Jose Ibarra Procedure Date: 06/03/2016 9:55 AM MRN: [id number] Endoscopist: Iva Boop , MD Age: 81 Referring MD:  Date of Birth: 1935/03/26 Gender: Male Account #: [id number] Procedure:                Upper GI endoscopy Indications:              Abdominal pain in the left upper quadrant Medicines:                Propofol per Anesthesia, Monitored Anesthesia Care Procedure:                Pre-Anesthesia Assessment:                           - Prior to the procedure, a History and Physical                            was performed, and patient medications and                            allergies were reviewed. The patient's tolerance of                            previous anesthesia was also reviewed. The risks                            and benefits of the procedure and the sedation                            options and risks were discussed with the patient.                            All questions were answered, and informed consent                            was obtained. Prior Anticoagulants: The patient                            last took aspirin 1 day prior to the procedure. ASA                            Grade Assessment: III - A patient with severe                            systemic disease. After reviewing the risks and                            benefits, the patient was deemed in satisfactory                            condition to undergo the procedure.                           After obtaining informed consent, the endoscope was  passed under direct vision. Throughout the                            procedure, the patient's blood pressure, pulse, and                            oxygen saturations were monitored continuously. The                            Endoscope was introduced through the mouth, and                            advanced to the second part of duodenum. The upper                            GI  endoscopy was accomplished without difficulty.                            The patient tolerated the procedure well. Scope In: Scope Out: Findings:                 One non-bleeding cratered gastric ulcer with no                            stigmata of bleeding was found in the prepyloric                            region of the stomach. The lesion was 7 mm in                            largest dimension. Biopsies were taken with a cold                            forceps for histology. Verification of patient                            identification for the specimen was done. Estimated                            blood loss was minimal.                           Diffuse severe inflammation was found in the                            gastric antrum. Biopsies were taken with a cold                            forceps for histology. Verification of patient                            identification for the specimen was done. Estimated  blood loss was minimal.                           The exam was otherwise without abnormality.                           The cardia and gastric fundus were normal on                            retroflexion. Complications:            No immediate complications. Estimated Blood Loss:     Estimated blood loss was minimal. Impression:               - Non-bleeding gastric ulcer with no stigmata of                            bleeding. Biopsied.                           - Chronic gastritis. Biopsied.                           - The examination was otherwise normal. Recommendation:           - Patient has a contact number available for                            emergencies. The signs and symptoms of potential                            delayed complications were discussed with the                            patient. Return to normal activities tomorrow.                            Written discharge instructions were provided to the                             patient.                           - Resume previous diet.                           - Continue present medications.                           - Use Protonix (pantoprazole) 40 mg PO daily. DC                            omeprazole Iva Boop, MD 06/03/2016 10:23:14 AM This report has been signed electronically.

## 2016-06-03 NOTE — Progress Notes (Signed)
Called to room to assist during endoscopic procedure.  Patient ID and intended procedure confirmed with present staff. Received instructions for my participation in the procedure from the performing physician.  

## 2016-06-03 NOTE — Patient Instructions (Addendum)
I found a small stomach ulcer. You also have gastritis as in past.  I took biopsies to check it out.  I am changing omeprazole to pantoprazole 40 mg a day  I appreciate the opportunity to care for you. Iva Boop, MD, [facility]  Continue your Omeprazole 40 mg TWICE DAILY until your PROTONIX arrives. Continue Zantac as previously prescribed.  Handouts given: Gastritis.  YOU HAD AN ENDOSCOPIC PROCEDURE TODAY AT THE [facility]:   Refer to the procedure report that was given to you for any specific questions about what was found during the examination.  If the procedure report does not answer your questions, please call your gastroenterologist to clarify.  If you requested that your care partner not be given the details of your procedure findings, then the procedure report has been included in a sealed envelope for you to review at your convenience later.  YOU SHOULD EXPECT: Some feelings of bloating in the abdomen. Passage of more gas than usual.  Walking can help get rid of the air that was put into your GI tract during the procedure and reduce the bloating. If you had a lower endoscopy (such as a colonoscopy or flexible sigmoidoscopy) you may notice spotting of blood in your stool or on the toilet paper. If you underwent a bowel prep for your procedure, you may not have a normal bowel movement for a few days.  Please Note:  You might notice some irritation and congestion in your nose or some drainage.  This is from the oxygen used during your procedure.  There is no need for concern and it should clear up in a day or so.  SYMPTOMS TO REPORT IMMEDIATELY:    Following upper endoscopy (EGD)  Vomiting of blood or coffee ground material  New chest pain or pain under the shoulder blades  Painful or persistently difficult swallowing  New shortness of breath  Fever of 100F or higher  Black, tarry-looking stools  For urgent or emergent issues, a gastroenterologist can  be reached at any hour by calling [phone].   DIET:  We do recommend a small meal at first, but then you may proceed to your regular diet.  Drink plenty of fluids but you should avoid alcoholic beverages for 24 hours.  ACTIVITY:  You should plan to take it easy for the rest of today and you should NOT DRIVE or use heavy machinery until tomorrow (because of the sedation medicines used during the test).    FOLLOW UP: Our staff will call the number listed on your records the next business day following your procedure to check on you and address any questions or concerns that you may have regarding the information given to you following your procedure. If we do not reach you, we will leave a message.  However, if you are feeling well and you are not experiencing any problems, there is no need to return our call.  We will assume that you have returned to your regular daily activities without incident.  If any biopsies were taken you will be contacted by phone or by letter within the next 1-3 weeks.  Please call us at [phone] if you have not heard about the biopsies in 3 weeks.    SIGNATURES/CONFIDENTIALITY: You and/or your care partner have signed paperwork which will be entered into your electronic medical record.  These signatures attest to the fact that that the information above on your After Visit Summary has been reviewed and  is understood.  Full responsibility of the confidentiality of this discharge information lies with you and/or your care-partner.

## 2016-06-04 ENCOUNTER — Telehealth: Payer: Self-pay

## 2016-06-04 NOTE — Telephone Encounter (Signed)
Attempted to reach patient for post-procedure f/u. No answer. Left message that we will attempt to reach again later today and to call us with any questions/concerns that he may have.

## 2016-06-09 ENCOUNTER — Encounter: Payer: Self-pay | Admitting: Internal Medicine

## 2016-06-09 NOTE — Progress Notes (Signed)
Ulcer and gastritis biopsies are benign My chart letter to patient f/u office 2 mos

## 2016-06-10 ENCOUNTER — Telehealth: Payer: Self-pay | Admitting: Internal Medicine

## 2016-06-10 MED ORDER — PANTOPRAZOLE SODIUM 40 MG PO TBEC: 40.0000 mg | DELAYED_RELEASE_TABLET | Freq: Every day | ORAL | 3 refills | Status: DC

## 2016-06-10 NOTE — Telephone Encounter (Signed)
Called and left Jose Ibarra a message that his pantopprazole has been re-sent to [facility].

## 2016-08-31 ENCOUNTER — Ambulatory Visit: Payer: Medicare HMO | Admitting: Internal Medicine

## 2016-08-31 HISTORY — PX: RETINAL LASER PROCEDURE: SHX2339

## 2016-10-06 ENCOUNTER — Ambulatory Visit (INDEPENDENT_AMBULATORY_CARE_PROVIDER_SITE_OTHER): Payer: Medicare HMO | Admitting: Internal Medicine

## 2016-10-06 ENCOUNTER — Encounter: Payer: Self-pay | Admitting: Internal Medicine

## 2016-10-06 VITALS — BP 102/60 | HR 95 | Ht 68.0 in | Wt 179.0 lb

## 2016-10-06 DIAGNOSIS — K253 Acute gastric ulcer without hemorrhage or perforation: Secondary | ICD-10-CM

## 2016-10-06 DIAGNOSIS — K2970 Gastritis, unspecified, without bleeding: Secondary | ICD-10-CM

## 2016-10-06 NOTE — Patient Instructions (Signed)
   Glad you are better.  Please call back as needed.  I can refill the medication next year if needed, Dr. Drue NovelPaz probably can also.  I appreciate the opportunity to care for you. Iva Booparl E. Paulino Cork, MD, Clementeen GrahamFACG

## 2016-10-06 NOTE — Progress Notes (Signed)
Jose Ibarra 81 y.o. 11-26-1935 [id number]  Assessment & Plan:   Encounter Diagnoses  Name Primary?  Jose Ibarra Kitchen Antral ulcer, acute Yes  . Gastritis determined by biopsy     He is doing well. I think it makes sense to continue his chronic PPI given his years of intermittent off again on again symptoms. He will do so and see me as needed. Either I can refill this over the next couple of years or Dr. Everlene Other can do that.  I appreciate the opportunity to care for this patient. CC: Jose Harries, MD    Subjective:   Chief Complaint: Follow-up of ulcer and gastritis, doing well  HPI Jose Ibarra is here with his wife today, he had left upper quadrant pain, upper GI endoscopy demonstrated gastritis and an acute antral ulcer. I changed to pantoprazole 40 mg daily and symptom thereafter he said he became asymptomatic and he is very pleased with how he feels. Biopsies were benign. No H. pylori infection. Allergies  Allergen Reactions  . Cranberry   . Lisinopril Cough  . Niacin    Current Meds  Medication Sig  . acetaminophen (TYLENOL) 650 MG CR tablet Take 1,300 mg by mouth every 8 (eight) hours as needed for pain.  . Ascorbic Acid (VITAMIN C) 1000 MG tablet Take 1,000 mg by mouth daily.  Jose Ibarra Kitchen aspirin 81 MG tablet Take 81 mg by mouth daily.    Jose Ibarra Kitchen atorvastatin (LIPITOR) 40 MG tablet Take 40 mg by mouth daily.    . calcium-vitamin D (OSCAL WITH D) 250-125 MG-UNIT per tablet Take 1 tablet by mouth 2 (two) times daily.   Jose Ibarra Kitchen losartan (COZAAR) 25 MG tablet Take 25 mg by mouth daily.    . metoprolol (LOPRESSOR) 50 MG tablet Take 50 mg by mouth 2 (two) times daily.    . Multiple Vitamins-Minerals (CENTRUM PO) Take 1 capsule by mouth daily.  . nitroGLYCERIN (NITROSTAT) 0.6 MG SL tablet Place 0.6 mg under the tongue every 5 (five) minutes as needed.    . pantoprazole (PROTONIX) 40 MG tablet Take 1 tablet (40 mg total) by mouth daily before breakfast.  . vitamin B-12 (CYANOCOBALAMIN) 1000 MCG tablet Take  1,000 mcg by mouth daily.   Past Medical History:  Diagnosis Date  . C. difficile colitis 2011   relapsed x 1  . CAD (coronary artery disease)   . GERD (gastroesophageal reflux disease)   . Hip dislocation, right (HCC)   . Hyperlipemia   . Hypertension   . Lymphocytic colitis   . Lymphocytic gastritis   . Myocardial infarction (HCC) 2008   . Peptic ulcer disease    Past Surgical History:  Procedure Laterality Date  . APPENDECTOMY    . cataract surgery  11/2015  . COLONOSCOPY    . CORONARY ANGIOPLASTY WITH STENT PLACEMENT  2008, 2009   DES x 2  . FEMORAL HERNIA REPAIR     bilateral  . HEMORRHOID SURGERY    . INGUINAL HERNIA REPAIR     bilateral  . PACEMAKER INSERTION  2003  . RETINAL LASER PROCEDURE  08/31/2016  . TONSILLECTOMY    . TONSILLECTOMY    . TOTAL HIP ARTHROPLASTY     Bilateral   . TOTAL HIP REVISION  2011   left  . UPPER GASTROINTESTINAL ENDOSCOPY     Objective:   Physical Exam BP 102/60   Pulse 95   Ht 5\' 8"  (1.727 m)   Wt 179 lb (81.2 kg)   BMI 27.22 kg/m  Looks younger than stated age No acute distress

## 2017-11-02 HISTORY — PX: PACEMAKER GENERATOR CHANGE: SHX5998

## 2019-03-21 ENCOUNTER — Ambulatory Visit: Payer: Medicare HMO | Admitting: Internal Medicine

## 2019-03-21 ENCOUNTER — Encounter: Payer: Self-pay | Admitting: Internal Medicine

## 2019-03-21 VITALS — BP 100/60 | HR 72 | Temp 97.9°F | Ht 67.0 in | Wt 177.4 lb

## 2019-03-21 DIAGNOSIS — D696 Thrombocytopenia, unspecified: Secondary | ICD-10-CM

## 2019-03-21 DIAGNOSIS — R12 Heartburn: Secondary | ICD-10-CM | POA: Diagnosis not present

## 2019-03-21 DIAGNOSIS — R1012 Left upper quadrant pain: Secondary | ICD-10-CM

## 2019-03-21 DIAGNOSIS — K279 Peptic ulcer, site unspecified, unspecified as acute or chronic, without hemorrhage or perforation: Secondary | ICD-10-CM

## 2019-03-21 MED ORDER — OMEPRAZOLE 40 MG PO CPDR: 40.0000 mg | DELAYED_RELEASE_CAPSULE | Freq: Every day | ORAL | 3 refills | Status: DC

## 2019-03-21 NOTE — Patient Instructions (Signed)
I have prescribed omeprazole 40 mg daily - sent to [facility].  In the meantime buy some 20 mg omeprazole over the counter and take 2 each morning before breakfast.  Please call next week and make an appointment to see me in mid-April.  Call back sooner if other problems develop (hope not!)  I appreciate the opportunity to care for you. Iva Boop, MD, Clementeen Graham

## 2019-03-21 NOTE — Progress Notes (Signed)
Jose Ibarra 84 y.o. 1935-04-02 [id number]  Assessment & Plan:   Encounter Diagnoses  Name Primary?  . LUQ pain Yes  . Heartburn   . Peptic ulcer disease   . Thrombocytopenia (HCC)     He is not losing weight, with his history of peptic ulcer disease I wonder if he might not have gastritis or an ulcer again. It also sounds like some GERD involved. I am going to start him back on 40 mg omeprazole daily and he will call us next week to get a follow-up appointment when the April schedule is available to be seen in about 2 months. If he deteriorates before then I have instructed him to call me. If he does not resolve with then contemplate upper endoscopy.  Mild thrombocytopenia is chronic. No known liver disease based upon previous imaging labs etc.  I appreciate the opportunity to care for this patient. CC: Tracey Harries, MD     Subjective:   Chief Complaint: Heartburn and left upper quadrant pain  HPI 84 year old man with a history of lymphocytic gastritis, lymphocytic colitis/ileitis and peptic ulcer disease. When he was last seen in 2018 I recommend that he stay on a PPI regularly but he has come off of that. He recently switched to drinking orange juice in the mornings and started having a lot of burning in the esophagus and left upper quadrant pain, when he had orange juice and coffee. He had been drinking grape juice. Despite stopping these he has some persistent symptoms and it reminds him of his ulcer symptoms. Weights as below no unintentional weight loss. No dysphagia reported. 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  Wt Readings from Last 3 Encounters:  03/21/19 177 lb 6 oz (80.5 kg)  10/06/16 179 lb (81.2 kg)  06/03/16 177 lb (80.3 kg)   Data review with normal CMET except BUN 30 glucose 103 in November 2020 and September 2020 CBC with mild anemia hemoglobin 13.5 this is relatively stable he had 12.9 in 2019 and 14.4 in March 2020.  Normal white count and mildly low platelets which is chronic. Allergies  Allergen Reactions  . Cranberry   . Lisinopril Cough  . Niacin    Current Meds  Medication Sig  . acetaminophen (TYLENOL) 650 MG CR tablet Take 1,300 mg by mouth every 8 (eight) hours as needed for pain.  . Ascorbic Acid (VITAMIN C) 1000 MG tablet Take 1,000 mg by mouth daily.  Marland Kitchen aspirin 81 MG tablet Take 81 mg by mouth daily.    Marland Kitchen atorvastatin (LIPITOR) 40 MG tablet Take 40 mg by mouth daily.    . calcium-vitamin D (OSCAL WITH D) 250-125 MG-UNIT per tablet Take 1 tablet by mouth 2 (two) times daily.   Marland Kitchen losartan (COZAAR) 25 MG tablet Take 25 mg by mouth daily.    . metoprolol succinate (TOPROL-XL) 100 MG 24 hr tablet Take 1 tablet by mouth daily.  . Multiple Vitamins-Minerals (CENTRUM PO) Take 1 capsule by mouth daily.  . nitroGLYCERIN (NITROSTAT) 0.6 MG SL tablet Place 0.6 mg under the tongue every 5 (five) minutes as needed.    . vitamin B-12 (CYANOCOBALAMIN) 1000 MCG tablet Take 1,000 mcg by mouth daily.   Past Medical History:  Diagnosis Date  . C. difficile colitis 2011   relapsed x 1  . CAD (coronary artery disease)   . GERD (gastroesophageal reflux disease)   . Hip dislocation, right (HCC)   . Hyperlipemia   .  Hypertension   . Lymphocytic colitis   . Lymphocytic gastritis   . Myocardial infarction ([place]) 2008   . Peptic ulcer disease   . Thrombocytopenia ([place])    Past Surgical History:  Procedure Laterality Date  . APPENDECTOMY    . cataract surgery Bilateral 11/2015  . COLONOSCOPY  Multiple  . CORONARY ANGIOPLASTY WITH STENT PLACEMENT  2008, 2009   DES x 2  . FEMORAL HERNIA REPAIR     bilateral  . HEMORRHOID SURGERY    . INGUINAL HERNIA REPAIR     bilateral  . PACEMAKER GENERATOR CHANGE  11/2017  . PACEMAKER INSERTION  2003  . RETINAL LASER PROCEDURE  08/31/2016  . TONSILLECTOMY    . TOTAL HIP ARTHROPLASTY     Bilateral   . TOTAL HIP REVISION  2011   left  . 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   Review of Systems As above  Objective:   Physical Exam BP 100/60 (BP Location: Left Arm, Patient Position: Sitting, Cuff Size: Normal)   Pulse 72 Comment: irregular  Temp 97.9 F (36.6 C)   Ht 5\' 7"  (1.702 m) Comment: height measured without shoes  Wt 177 lb 6 oz (80.5 kg)   BMI 27.78 kg/m  NAD Lungs cta Cor NL Abdomen soft and nontender

## 2019-04-03 ENCOUNTER — Telehealth: Payer: Self-pay | Admitting: Internal Medicine

## 2019-04-03 NOTE — Telephone Encounter (Signed)
Dr. Leone Payor, be advised that it looks like the patient was moved up to see CKS tomorrow at 11:30 am. There must have been a cancellation.

## 2019-04-03 NOTE — Telephone Encounter (Signed)
Spoke to the patient who is is complaining of continued LUQ pain. He states the pain in continuous, nothing makes it better or worse. He stated he has been taking the 40 mg omeprazole daily 30 minutes prior to breakfast since 3/17 with no relief. The patient could not wait until mid April for an appointment with his continued pain, patient scheduled with APP next opening (no availability this week). Scheduled on 3/8 at 2:00 pm with CKS. Please advise.

## 2019-04-03 NOTE — Telephone Encounter (Signed)
Notes reviewed.  ?

## 2019-04-03 NOTE — Telephone Encounter (Signed)
OK that works Can set up EGD then unless she thinks something different

## 2019-04-03 NOTE — Telephone Encounter (Signed)
Noted  

## 2019-04-03 NOTE — Telephone Encounter (Signed)
Pt states that he is still dealing with same sxs of pain on his left side. He states that medication ha not helped with that.

## 2019-04-03 NOTE — Telephone Encounter (Signed)
I recommend to set him up for an endoscopy in [facility]   Dx LUQ pain

## 2019-04-04 ENCOUNTER — Ambulatory Visit: Payer: Medicare HMO | Admitting: Nurse Practitioner

## 2019-04-10 ENCOUNTER — Ambulatory Visit: Payer: Medicare HMO | Admitting: Nurse Practitioner

## 2019-04-11 ENCOUNTER — Ambulatory Visit: Payer: Medicare HMO | Admitting: Nurse Practitioner

## 2019-04-11 ENCOUNTER — Encounter: Payer: Self-pay | Admitting: Nurse Practitioner

## 2019-04-11 VITALS — BP 90/56 | HR 84 | Temp 97.7°F | Ht 67.0 in | Wt 181.4 lb

## 2019-04-11 DIAGNOSIS — R1012 Left upper quadrant pain: Secondary | ICD-10-CM | POA: Diagnosis not present

## 2019-04-11 DIAGNOSIS — Z8719 Personal history of other diseases of the digestive system: Secondary | ICD-10-CM | POA: Diagnosis not present

## 2019-04-11 DIAGNOSIS — Z8711 Personal history of peptic ulcer disease: Secondary | ICD-10-CM

## 2019-04-11 NOTE — Progress Notes (Signed)
04/11/2019 Jose Ibarra [id number] 1935/08/10   Chief Complaint: Left upper abdominal pain   History of Present Illness: Jose Ibarra is an 84 year old male with a history of hypertension, CAD, MI 2008, s/p stent 2008 and 2009 and pacemaker placement, CM with EF 20 -25%,  GERD, gastric ulcers and lymphocytic gastritis/colitis and thrombocytopenia.  He is followed by cardiologist, Dr. Abran Richard. His scheduled to see cardiologist Dr. Ola Spurr tomorrow for pacemaker check and to discuss the possible need for a defibrillator. He complains of having left upper abdominal pain that is below the rib cage which started 02/2019. He stated this left upper/mid abdominal pain started Jan. 2021. He stated this pain is unlike the LUQ pain he experienced when he had stomach ulcers. His left upper abdominal pain occurs upon awakening from sleep in the morning which is fairly constant. He is able to sleep at night without worsening pain. Eating doesn't worsen or improve his pain. No heartburn. He has some dysphagia, feels like it is harder to swallow solid foods and liquids. Food possibly feels like it gets stuck in the upper esophagus which occurs approximately once weekly. He drinks water and the food passes. He is eating a pureed diet as he had multiple teeth pulled 11/2013 and he is waiting for his new dentures. He has lost 10 lbs over the past 5 months. No fever, sweats or chills. No recent antibiotics or steroids. He is taking Omeprazole 59m once daily. He takes ASA 845monce daily. No other NSAIDS. He is passing a normal brown formed BM daily. No rectal bleeding or melena. His most recent colonoscopy was in 2012 which showed lymphocytic colitis. He takes care of his wife who has progressive Alzheimer' disease.   Labs 04/05/2019: Glu 92. BUN 20. Cr. 0.96. Na 142. K 4.2. Alk phos 47. AST 25. ALT 25. T. Bili 0.7. WBC 6.2. Hg 14.1. HCT 43.3. PLT 129.   His BP is low today 90/56. He denies having chest pain, SOB or  dizziness.   EGD 06/03/2016:  - Non-bleeding gastric ulcer with no stigmata of bleeding. No H. Pylori.  - Chronic gastritis.  - The examination was otherwise normal.  EGD 02/28/2010:  - Moderate gastritis. Biopsies showed lymphocytic gastritis.  - Diverticulum pyloric channel   Colonoscopy 02/24/2010:  - Nodular mucosa in the TI - Abnormal mucosa in the right colon - Abnormal mucosa in the left colon, pathy erythema and loss of vascular pattern. Biopsies showed l   lymphocytic colitis.   Current Outpatient Medications on File Prior to Visit  Medication Sig Dispense Refill  . acetaminophen (TYLENOL) 650 MG CR tablet Take 1,300 mg by mouth every 8 (eight) hours as needed for pain.    . Ascorbic Acid (VITAMIN C) 1000 MG tablet Take 1,000 mg by mouth daily.    . Marland Kitchenspirin 81 MG tablet Take 81 mg by mouth daily.      . Marland Kitchentorvastatin (LIPITOR) 40 MG tablet Take 40 mg by mouth daily.      . calcium-vitamin D (OSCAL WITH D) 250-125 MG-UNIT per tablet Take 1 tablet by mouth 2 (two) times daily.     . Marland Kitchenosartan (COZAAR) 25 MG tablet Take 25 mg by mouth daily.      . metoprolol succinate (TOPROL-XL) 100 MG 24 hr tablet Take 1 tablet by mouth daily.    . Multiple Vitamins-Minerals (CENTRUM PO) Take 1 capsule by mouth daily.    . nitroGLYCERIN (NITROSTAT) 0.6 MG SL tablet Place  0.6 mg under the tongue every 5 (five) minutes as needed.      Marland Kitchen omeprazole (PRILOSEC) 40 MG capsule Take 1 capsule (40 mg total) by mouth daily. 90 capsule 3  . vitamin B-12 (CYANOCOBALAMIN) 1000 MCG tablet Take 1,000 mcg by mouth daily.     No current facility-administered medications on file prior to visit.    Allergies  Allergen Reactions  . Cranberry   . Lisinopril Cough  . Niacin     Current Medications, Allergies, Past Medical History, Past Surgical History, Family History and Social History were reviewed in [employer] record.   Physical Exam: BP (!) 90/56   Pulse 84   Temp 97.7 F  (36.5 C)   Ht '5\' 7"'  (1.702 m)   Wt 181 lb 6 oz (82.3 kg)   BMI 28.41 kg/m  General: Well developed  85 year old male in no acute distress. Head: Normocephalic and atraumatic. Eyes:  No scleral icterus. Conjunctiva pink . Ears: Normal auditory acuity. Lungs: Clear throughout to auscultation. Chest: Prominent bony prominence at the right sternoclavicular joint. Heart: Rhythm slightly irregular, no murmur. Abdomen: Soft, nondistended. Tenderness LUQ 6 cm below the costal margin. No masses or hepatomegaly. Normal bowel sounds x 4 quadrants. No bruits.  Rectal: Deferred.  Musculoskeletal: Symmetrical with no gross deformities. Extremities: No edema. Neurological: Alert oriented x 4. No focal deficits.  Psychological:  Alert and cooperative. Normal mood and affect  Assessment and Recommendations:   35. 84 year old male with a history of GERD, lymphocytic gastritis and benign gastric ulcer presents with LUQ pain  6 cm below the costal margin which is unlike the LUQ pain he experienced when diagnosed with ulcers/gastriits.  -Abdominal/Pelvic CT with contrast  2. Dysphagia -EGD to be scheduled after abd/pelvic CT results reviewed. -Continue Omeprazole 13m daily for now  3. Thrombocytopenia of unclear etiology. No history of cirrhosis. No splenomegaly on exam.   4. CAD. CM with EF 20-25%. Hypotensive in office today. BP 90/56. Patient is asymptomatic.  -Patient to proceed with follow up with cardiologist as scheduled tomorrow, advised patient to discuss BP management with concerns of low BP today.

## 2019-04-11 NOTE — Patient Instructions (Addendum)
If you are age 82 or older, your body mass index should be between 23-30. Your Body mass index is 28.41 kg/m. If this is out of the aforementioned range listed, please consider follow up with your Primary Care Provider.  If you are age 63 or younger, your body mass index should be between 19-25. Your Body mass index is 28.41 kg/m. If this is out of the aformentioned range listed, please consider follow up with your Primary Care Provider.   You have been scheduled for a CT scan of the abdomen and pelvis at [facility].   You are scheduled on 3/18/21at 3:30 pm. You should arrive 15 minutes prior to your appointment time for registration. Please follow the written instructions below on the day of your exam:  WARNING: IF YOU ARE ALLERGIC TO IODINE/X-RAY DYE, PLEASE NOTIFY RADIOLOGY IMMEDIATELY AT [phone]! YOU WILL BE GIVEN A 13 HOUR PREMEDICATION PREP.  1) Do not eat or drink anything after 11:30 am (4 hours prior to your test) 2) You have been given 2 bottles of oral contrast to drink. The solution may taste better if refrigerated, but do NOT add ice or any other liquid to this solution. Shake well before drinking.    Drink 1 bottle of contrast @ 1:30 pm (2 hours prior to your exam)  Drink 1 bottle of contrast @ 2:30 pm (1 hour prior to your exam)  You may take any medications as prescribed with a small amount of water, if necessary. If you take any of the following medications: METFORMIN, GLUCOPHAGE, GLUCOVANCE, AVANDAMET, RIOMET, FORTAMET, [place] MET, JANUMET, GLUMETZA or METAGLIP, you MAY be asked to HOLD this medication 48 hours AFTER the exam.  The purpose of you drinking the oral contrast is to aid in the visualization of your intestinal tract. The contrast solution may cause some diarrhea. Depending on your individual set of symptoms, you may also receive an intravenous injection of x-ray contrast/dye. Plan on being at [facility] for 30 minutes or longer, depending on  the type of exam you are having performed.  This test typically takes 30-45 minutes to complete.  If you have any questions regarding your exam or if you need to reschedule, you may call the CT department at [phone] between the hours of 8:00 am and 5:00 pm, Monday-Friday.  ________________________________________________________________ Call the office if your abdominal pain worsens.  Further recommendation to be determined after CT results received.  Call Jaclyn Shaggy, NP after you see Dr. Ola Spurr tomorrow.  Continue Omeprazole 40 mg once daily for now.  Thank you for choosing me and [facility].   Carl Best, NP        Due to recent changes in healthcare laws, you may see the results of your imaging and laboratory studies on MyChart before your provider has had a chance to review them.  We understand that in some cases there may be results that are confusing or concerning to you. Not all laboratory results come back in the same time frame and the provider may be waiting for multiple results in order to interpret others.  Please give [place] 48 hours in order for your provider to thoroughly review all the results before contacting the office for clarification of your results.

## 2019-04-12 ENCOUNTER — Telehealth: Payer: Self-pay | Admitting: Nurse Practitioner

## 2019-04-13 NOTE — Telephone Encounter (Signed)
Fyi.

## 2019-04-13 NOTE — Telephone Encounter (Signed)
Patient was to address his low bp with his cardiologist. Not sure why he had concerns about CT?  Await CT results.

## 2019-04-18 ENCOUNTER — Telehealth: Payer: Self-pay | Admitting: General Surgery

## 2019-04-18 NOTE — Telephone Encounter (Signed)
Faxed C[facility] to [place] for the patients CT. Fax transmittal shows receipt

## 2019-04-20 ENCOUNTER — Ambulatory Visit (HOSPITAL_COMMUNITY)
Admission: RE | Admit: 2019-04-20 | Discharge: 2019-04-20 | Disposition: A | Discharge status: Home / Self Care | Payer: Medicare HMO | Source: Ambulatory Visit | Attending: Nurse Practitioner | Admitting: Nurse Practitioner

## 2019-04-20 ENCOUNTER — Other Ambulatory Visit: Payer: Self-pay

## 2019-04-20 DIAGNOSIS — R1012 Left upper quadrant pain: Secondary | ICD-10-CM | POA: Insufficient documentation

## 2019-04-20 DIAGNOSIS — Z8719 Personal history of other diseases of the digestive system: Secondary | ICD-10-CM | POA: Diagnosis present

## 2019-04-20 DIAGNOSIS — Z8711 Personal history of peptic ulcer disease: Secondary | ICD-10-CM

## 2019-04-20 MED ORDER — SODIUM CHLORIDE (PF) 0.9 % IJ SOLN
INTRAMUSCULAR | Status: AC
  Filled 2019-04-20: qty 50

## 2019-04-20 MED ORDER — IOHEXOL 300 MG/ML  SOLN
100.0000 mL | Freq: Once | INTRAMUSCULAR | Status: AC | PRN
  Administered 2019-04-20: 100 mL via INTRAVENOUS

## 2019-04-25 ENCOUNTER — Telehealth: Payer: Self-pay | Admitting: Nurse Practitioner

## 2019-04-25 NOTE — Telephone Encounter (Signed)
Patient calling for CT results. 

## 2019-04-25 NOTE — Telephone Encounter (Signed)
Please review CT results and advise

## 2019-04-26 NOTE — Progress Notes (Signed)
Bre, I called the patient and explained the CT does not explain his significant LUQ pain he had when I saw him on 3/9. His LUQ pain is much less, therefore, I would not schedule an EGD. His BP was low and his cardiologist stopped his Losartan and Reduced his Metoprolol to 25mg  bid.   Please call the patient and schedule him for a follow up appointment with Dr. in 4 weeks.   Patient will call our office if his pain worsens.  He will follow up with his PCP regarding pulmonary interstitial findings.  THX

## 2019-04-26 NOTE — Telephone Encounter (Signed)
See CTAP report notes

## 2019-05-24 ENCOUNTER — Ambulatory Visit: Payer: Medicare HMO | Admitting: Internal Medicine

## 2019-08-03 ENCOUNTER — Ambulatory Visit: Payer: Medicare HMO | Admitting: Internal Medicine

## 2019-08-03 ENCOUNTER — Encounter: Payer: Self-pay | Admitting: Internal Medicine

## 2019-08-03 VITALS — BP 106/64 | HR 87 | Ht 69.0 in | Wt 187.4 lb

## 2019-08-03 DIAGNOSIS — I255 Ischemic cardiomyopathy: Secondary | ICD-10-CM

## 2019-08-03 DIAGNOSIS — J849 Interstitial pulmonary disease, unspecified: Secondary | ICD-10-CM

## 2019-08-03 DIAGNOSIS — R1310 Dysphagia, unspecified: Secondary | ICD-10-CM

## 2019-08-03 DIAGNOSIS — R1012 Left upper quadrant pain: Secondary | ICD-10-CM

## 2019-08-03 DIAGNOSIS — R1319 Other dysphagia: Secondary | ICD-10-CM

## 2019-08-03 NOTE — Patient Instructions (Signed)
You have been scheduled for a Barium Esophogram at [facility] (1st floor of the hospital) on 08/11/2019 at 10:30AM. Please arrive 15 minutes prior to your appointment for registration. Make certain not to have anything to eat or drink 3 hours prior to your test. If you need to reschedule for any reason, please contact radiology at [phone] to do so. __________________________________________________________________ A barium swallow is an examination that concentrates on views of the esophagus. This tends to be a double contrast exam (barium and two liquids which, when combined, create a gas to distend the wall of the oesophagus) or single contrast (non-ionic iodine based). The study is usually tailored to your symptoms so a good history is essential. Attention is paid during the study to the form, structure and configuration of the esophagus, looking for functional disorders (such as aspiration, dysphagia, achalasia, motility and reflux) EXAMINATION You may be asked to change into a gown, depending on the type of swallow being performed. A radiologist and radiographer will perform the procedure. The radiologist will advise you of the type of contrast selected for your procedure and direct you during the exam. You will be asked to stand, sit or lie in several different positions and to hold a small amount of fluid in your mouth before being asked to swallow while the imaging is performed .In some instances you may be asked to swallow barium coated marshmallows to assess the motility of a solid food bolus. The exam can be recorded as a digital or video fluoroscopy procedure. POST PROCEDURE It will take 1-2 days for the barium to pass through your system. To facilitate this, it is important, unless otherwise directed, to increase your fluids for the next 24-48hrs and to resume your normal diet.  This test typically takes about 30 minutes to  perform. __________________________________________________________________________________   I appreciate the opportunity to care for you. Stan Head, MD, [facility]

## 2019-08-03 NOTE — Progress Notes (Signed)
Jose Ibarra 84 y.o. 03-16-1935 [id number]  Assessment & Plan:   Encounter Diagnoses  Name Primary?  . LUQ pain Yes  . Esophageal dysphagia   . Cardiomyopathy, ischemic   . Chronic interstitial lung disease (HCC) ? - abnl CT findings     Left upper quadrant pain is improved/resolved.  Etiology not clear.  He relates to getting antibiotics for when he was having pacemaker exchange.  CT scan was unrevealing.  Dysphagia issues will work-up with barium swallow and tablet.  He has a bad cardiomyopathy was dyspneic just moving around in the exam room.  If he needs an EGD we can do one but it would need to be done at the hospital.  Some of his symptoms suggest he could need a modified barium swallow perhaps.  He sees Dr. Everlene Other  in September he has these changes in the CT abdomen and pelvis where the lung fields suggest chronic interstitial lung disease.  I will defer to Dr. Everlene Other regarding follow-up CT scanning of the lungs.  I appreciate the opportunity to care for this patient. CC: Tracey Harries, MD   Subjective:   Chief Complaint: Left upper quadrant pain and dysphagia  HPI The patient is here for follow-up after seeing Alcide Evener, NP in March she was having some left upper quadrant pain a CT scan was unrevealing with respect to the GI tract but did suggest some chronic interstitial lung disease.  He is improved overall.  However still has dysphagia.  LUQ pain beter after Abx for pacer replacement - did not get AICD - may need one if EF does not get better w/ meds  DOE walking in house room-room  Still caring for wife - she is in hospice  Now having persistent liquid, solid and pills dysphagia Suprasternal sticing point No cough  Allergies  Allergen Reactions  . Cranberry   . Lisinopril Cough  . Niacin    Current Meds  Medication Sig  . acetaminophen (TYLENOL) 650 MG CR tablet Take 1,300 mg by mouth every 8 (eight) hours as needed for pain.  . Ascorbic  Acid (VITAMIN C) 1000 MG tablet Take 1,000 mg by mouth daily.  Marland Kitchen aspirin 81 MG tablet Take 81 mg by mouth daily.    Marland Kitchen atorvastatin (LIPITOR) 40 MG tablet Take 40 mg by mouth daily.    . calcium-vitamin D (OSCAL WITH D) 250-125 MG-UNIT per tablet Take 1 tablet by mouth 2 (two) times daily.   . metoprolol succinate (TOPROL-XL) 100 MG 24 hr tablet Take 1 tablet by mouth daily.  . Multiple Vitamins-Minerals (CENTRUM PO) Take 1 capsule by mouth daily.  . nitroGLYCERIN (NITROSTAT) 0.6 MG SL tablet Place 0.6 mg under the tongue every 5 (five) minutes as needed.    Marland Kitchen omeprazole (PRILOSEC) 40 MG capsule Take 1 capsule (40 mg total) by mouth daily.  . vitamin B-12 (CYANOCOBALAMIN) 1000 MCG tablet Take 1,000 mcg by mouth daily.   Past Medical History:  Diagnosis Date  . C. difficile colitis 2011   relapsed x 1  . CAD (coronary artery disease)   . GERD (gastroesophageal reflux disease)   . Hip dislocation, right (HCC)   . Hyperlipemia   . Hypertension   . Lymphocytic colitis   . Lymphocytic gastritis   . Myocardial infarction (HCC) 2008   . Peptic ulcer disease   . Thrombocytopenia (HCC)    Past Surgical History:  Procedure Laterality Date  . APPENDECTOMY    . cataract surgery Bilateral 11/2015  .  COLONOSCOPY  Multiple  . CORONARY ANGIOPLASTY WITH STENT PLACEMENT  2008, 2009   DES x 2  . FEMORAL HERNIA REPAIR     bilateral  . HEMORRHOID SURGERY    . INGUINAL HERNIA REPAIR     bilateral  . PACEMAKER GENERATOR CHANGE  11/2017  . PACEMAKER INSERTION  2003  . RETINAL LASER PROCEDURE  08/31/2016  . TONSILLECTOMY    . TOTAL HIP ARTHROPLASTY     Bilateral   . TOTAL HIP REVISION  2011   left  . 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   Review of Systems  As per HPI Objective:   Physical Exam BP 106/64   Pulse 87   Ht 5\' 9"  (1.753 m)   Wt 187 lb 6.4 oz (85 kg)   SpO2 93%   BMI 27.67 kg/m  NAD - dyspneic getting onto exam table Lungs cta Neck + JVD no mass Cor no rmg S1S2 no gallup abd soft and nontender

## 2019-08-11 ENCOUNTER — Other Ambulatory Visit: Payer: Self-pay

## 2019-08-11 ENCOUNTER — Ambulatory Visit (HOSPITAL_COMMUNITY)
Admission: RE | Admit: 2019-08-11 | Discharge: 2019-08-11 | Disposition: A | Discharge status: Home / Self Care | Payer: Medicare HMO | Source: Ambulatory Visit | Attending: Internal Medicine | Admitting: Internal Medicine

## 2019-08-11 DIAGNOSIS — R1319 Other dysphagia: Secondary | ICD-10-CM

## 2019-08-11 DIAGNOSIS — R1310 Dysphagia, unspecified: Secondary | ICD-10-CM | POA: Diagnosis not present

## 2020-10-08 ENCOUNTER — Other Ambulatory Visit (INDEPENDENT_AMBULATORY_CARE_PROVIDER_SITE_OTHER): Payer: Medicare HMO

## 2020-10-08 ENCOUNTER — Ambulatory Visit: Payer: Medicare HMO | Admitting: Physician Assistant

## 2020-10-08 ENCOUNTER — Encounter: Payer: Self-pay | Admitting: Physician Assistant

## 2020-10-08 VITALS — BP 102/64 | HR 76 | Ht 68.0 in | Wt 189.0 lb

## 2020-10-08 DIAGNOSIS — R109 Unspecified abdominal pain: Secondary | ICD-10-CM

## 2020-10-08 LAB — BASIC METABOLIC PANEL
BUN: 25 mg/dL — ABNORMAL HIGH (ref 6–23)
CO2: 28 mEq/L (ref 19–32)
Calcium: 9.4 mg/dL (ref 8.4–10.5)
Chloride: 104 mEq/L (ref 96–112)
Creatinine, Ser: 0.89 mg/dL (ref 0.40–1.50)
GFR: 79.45 mL/min (ref >60.00)
Glucose, Bld: 105 mg/dL — ABNORMAL HIGH (ref 70–99)
Potassium: 4.3 mEq/L (ref 3.5–5.1)
Sodium: 139 mEq/L (ref 135–145)

## 2020-10-08 MED ORDER — HYOSCYAMINE SULFATE 0.125 MG SL SUBL: 0.1250 mg | SUBLINGUAL_TABLET | Freq: Four times a day (QID) | SUBLINGUAL | 1 refills | Status: DC | PRN

## 2020-10-08 NOTE — Progress Notes (Signed)
Chief Complaint: Abdominal pain  HPI:    Jose Ibarra is a an 85 year old male with a past medical history of CAD and cardiomyopathy with EF 20-25%, GERD, lymphocytic colitis and others listed below, known to Dr. Leone Payor, who was referred to me by Tracey Harries, MD for a complaint of abdominal pain.    02/28/2010 EGD with findings of moderate gastritis in antrum and diverticulum in the pyloric channel and otherwise normal.    02/24/2010 colonoscopy with nodular mucosa in the TI, abnormal mucosa in the right and left colon and path erythema and loss of vascular pattern, biopsy showed lymphocytic colitis.    04/03/2016 office visit with me for left upper quadrant pain, this was occurring immediately after eating certain foods.  At that time increase patient's Omeprazole to 40 mg daily and instructed him to use his Zantac 150 mg at night before going to bed.    06/03/2016 EGD with nonbleeding gastric ulcer with no stigmata of bleeding, no H. pylori, chronic gastritis and otherwise normal.    08/03/2019 office visit with Dr. Leone Payor left upper quadrant pain follow-up.  At that time it had improved/resolved.  He related to getting antibiotics for pacemaker exchange recently.  Had a CT scan which was unrevealing.  At that time discussed some trouble swallowing and further work-up was ordered with a barium swallow and tablet.  It is noted he had bad cardiomyopathy and was dyspneic just moving around the exam room.  Discussed if he needed an EGD he would needed in the hospital.  CT had shown some chronic interstitial lung disease and is recommended he see Dr. Everlene Other about this.    08/11/2019 esophagram with no abnormality that time was discussed that his esophagus was okay and he did not need further testing.  He was told to cut food small and chew well, eat slowly and follow with liquids.    03/29/2020 patient had a pacemaker with defibrillator placed.    Today, the patient tells me that his left-sided abdominal pain has  come back about 2 to 3 weeks ago.  He tells me that he was taking his Omeprazole 40 mg as needed and started taking this daily for 2 weeks straight but had no change in his pain which he describes as a constant dull pain towards left side of his abdomen rated as a 10/10 in the morning when he bends over which seems to get slightly better throughout the day maybe a 6-7/10.  Apparently "sitting straight up helps", nothing seems to completely take it away and bending over seems to make it worse.  Tells me his bowel movements are normal with 2 sometimes 3 soft solid stools a day.  He has had no change in his pain level when eating and has even cut back on spicy foods etc. but this has not helped at all.  Tells me other than this discomfort there is really nothing else GI wise going on.    Does have a wife with Alzheimer's at home who is now on hospice, previously he was using a back brace and helping to lift her here and there and they now have a lift at home.    Denies fever, chills, weight loss, change in bowel habits, heartburn, reflux, nausea, vomiting or symptoms that awaken him from sleep.  Past Medical History:  Diagnosis Date   C. difficile colitis 2011   relapsed x 1   CAD (coronary artery disease)    GERD (gastroesophageal reflux disease)  Hip dislocation, right (HCC)    Hyperlipemia    Hypertension    Lymphocytic colitis    Lymphocytic gastritis    Myocardial infarction (HCC) 2008    Peptic ulcer disease    Thrombocytopenia [facility])     Past Surgical History:  Procedure Laterality Date   APPENDECTOMY     cataract surgery Bilateral 11/2015   COLONOSCOPY  Multiple   CORONARY ANGIOPLASTY WITH STENT PLACEMENT  2008, 2009   DES x 2   FEMORAL HERNIA REPAIR     bilateral   HEMORRHOID SURGERY     INGUINAL HERNIA REPAIR     bilateral   PACEMAKER GENERATOR CHANGE  11/2017   PACEMAKER INSERTION  2003   RETINAL LASER PROCEDURE  08/31/2016   TONSILLECTOMY     TOTAL HIP ARTHROPLASTY      Bilateral    TOTAL HIP REVISION  2011   left   UPPER GASTROINTESTINAL ENDOSCOPY  Multiple    Current Outpatient Medications  Medication Sig Dispense Refill   acetaminophen (TYLENOL) 650 MG CR tablet Take 1,300 mg by mouth every 8 (eight) hours as needed for pain.     Ascorbic Acid (VITAMIN C) 1000 MG tablet Take 1,000 mg by mouth daily.     aspirin 81 MG tablet Take 81 mg by mouth daily.       atorvastatin (LIPITOR) 40 MG tablet Take 40 mg by mouth daily.       calcium-vitamin D (OSCAL WITH D) 250-125 MG-UNIT per tablet Take 1 tablet by mouth 2 (two) times daily.      metoprolol succinate (TOPROL-XL) 100 MG 24 hr tablet Take 1 tablet by mouth daily.     Multiple Vitamins-Minerals (CENTRUM PO) Take 1 capsule by mouth daily.     nitroGLYCERIN (NITROSTAT) 0.6 MG SL tablet Place 0.6 mg under the tongue every 5 (five) minutes as needed.       omeprazole (PRILOSEC) 40 MG capsule Take 1 capsule (40 mg total) by mouth daily. 90 capsule 3   vitamin B-12 (CYANOCOBALAMIN) 1000 MCG tablet Take 1,000 mcg by mouth daily.     No current facility-administered medications for this visit.    Allergies as of 10/08/2020 - Review Complete 08/03/2019  Allergen Reaction Noted   Cranberry  01/08/2011   Lisinopril Cough 05/27/2016   Niacin      Family History  Problem Relation Age of Onset   Breast cancer Sister    Heart attack Brother        x2    Diabetes Sister    Hypertension Sister    Heart disease Brother    Heart disease Mother    Colon cancer Neg Hx    Colon polyps Neg Hx     Social History   Socioeconomic History   Marital status: Married    Spouse name: Not on file   Number of children: 2   Years of education: Not on file   Highest education level: Not on file  Occupational History   Occupation: retired  Tobacco Use   Smoking status: Former   Smokeless tobacco: Never   Tobacco comments:    quit 1970  Vaping Use   Vaping Use: Never used  Substance and Sexual Activity    Alcohol use: No   Drug use: No   Sexual activity: Never    Partners: Female  Other Topics Concern   Not on file  Social History Narrative   Married, retired   Engineer, 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  Social Determinants of Health   Financial Resource Strain: Not on file  Food Insecurity: Not on file  Transportation Needs: Not on file  Physical Activity: Not on file  Stress: Not on file  Social Connections: Not on file  Intimate Partner Violence: Not on file    Review of Systems:    Constitutional: No weight loss, fever or chills Cardiovascular: No chest pain Respiratory: No SOB  Gastrointestinal: See HPI and otherwise negative   Physical Exam:  Vital signs: BP 102/64   Pulse 76   Ht 5\' 8"  (1.727 m)   Wt 189 lb (85.7 kg)   BMI 28.74 kg/m    Constitutional:   Pleasant AA male appears to be in NAD, Well developed, Well nourished, alert and cooperative Respiratory: Respirations even and unlabored. Lungs clear to auscultation bilaterally.   No wheezes, crackles, or rhonchi.  Cardiovascular: Normal S1, S2. No MRG. Regular rate and rhythm. No peripheral edema, cyanosis or pallor.  Gastrointestinal:  Soft, nondistended, nontender. No rebound or guarding. Normal bowel sounds. No appreciable masses or hepatomegaly. Rectal:  Not performed.  Msk:  Symmetrical without gross deformities. Without edema, no deformity or joint abnormality.  Psychiatric: Demonstrates good judgement and reason without abnormal affect or behaviors.  See HPI for recent labs and imaging.  Assessment: 1.  Chronic left-sided abdominal pain: Initially worse with eating years ago, better with increase in Omeprazole, then last year had CT with no etiology, pain resolved on its own, now on the left side again constant throughout the day some worse with bending over, unable to reproduce with palpation; question whether this is musculoskeletal versus intra-abdominal origin  Plan: 1.  Ordered CT of the abdomen and  pelvis with contrast for further evaluation. 2.  Prescribed Hyoscyamine sulfate 0.125 mg sublingual tabs every 6 hours as needed #15 with 2 refills to see if this is related to bowel cramping. 3.  Discussed with the patient that he has had this now over the past couple of years and we have never really deciphered an etiology.  Does describe that it worsens with bending over, I wonder if it is more musculoskeletal?  Discussed this briefly with him. 4.  Patient to follow in clinic per recommendations after imaging above.  , PA-C [facility] 10/08/2020, 9:54 AM  Cc: 12/08/2020, MD

## 2020-10-08 NOTE — Patient Instructions (Addendum)
Your provider has requested that you go to the basement level for lab work before leaving today. Press "B" on the elevator. The lab is located at the first door on the left as you exit the elevator.  IMAGING:  You will be contacted by [facility] Scheduling (Your caller ID will indicate phone # [phone]) within the next business 7-10 business days to schedule your CT abdomen & pelvis. If you have not heard from them within 7-10 business days, please call [facility] Scheduling at [phone] to follow up on the status of your appointment.    We have sent the following medications to your pharmacy for you to pick up at your convenience: Hyoscyamine 0.125 SL tablet every 6 hours as needed.  If you are age 83 or older, your body mass index should be between 23-30. Your Body mass index is 28.74 kg/m. If this is out of the aforementioned range listed, please consider follow up with your Primary Care Provider.  If you are age 83 or younger, your body mass index should be between 19-25. Your Body mass index is 28.74 kg/m. If this is out of the aformentioned range listed, please consider follow up with your Primary Care Provider.   __________________________________________________________  The  GI providers would like to encourage you to use [facility] to communicate with providers for non-urgent requests or questions.  Due to long hold times on the telephone, sending your provider a message by [facility] may be a faster and more efficient way to get a response.  Please allow 48 business hours for a response.  Please remember that this is for non-urgent requests.

## 2020-10-23 ENCOUNTER — Other Ambulatory Visit: Payer: Self-pay

## 2020-10-23 ENCOUNTER — Encounter (HOSPITAL_BASED_OUTPATIENT_CLINIC_OR_DEPARTMENT_OTHER): Payer: Self-pay

## 2020-10-23 ENCOUNTER — Ambulatory Visit (HOSPITAL_BASED_OUTPATIENT_CLINIC_OR_DEPARTMENT_OTHER)
Admission: RE | Admit: 2020-10-23 | Discharge: 2020-10-23 | Disposition: A | Discharge status: Home / Self Care | Payer: Medicare HMO | Source: Ambulatory Visit | Attending: Physician Assistant | Admitting: Physician Assistant

## 2020-10-23 DIAGNOSIS — R109 Unspecified abdominal pain: Secondary | ICD-10-CM | POA: Insufficient documentation

## 2020-10-23 MED ORDER — IOHEXOL 350 MG/ML SOLN
100.0000 mL | Freq: Once | INTRAVENOUS | Status: AC | PRN
  Administered 2020-10-23: 85 mL via INTRAVENOUS

## 2021-10-24 ENCOUNTER — Other Ambulatory Visit: Payer: Medicare HMO

## 2021-10-24 ENCOUNTER — Ambulatory Visit: Payer: Medicare HMO | Admitting: Physician Assistant

## 2021-10-24 ENCOUNTER — Encounter: Payer: Self-pay | Admitting: Physician Assistant

## 2021-10-24 VITALS — BP 108/68 | HR 82 | Ht 68.0 in | Wt 189.5 lb

## 2021-10-24 DIAGNOSIS — R140 Abdominal distension (gaseous): Secondary | ICD-10-CM

## 2021-10-24 DIAGNOSIS — R197 Diarrhea, unspecified: Secondary | ICD-10-CM | POA: Diagnosis not present

## 2021-10-24 NOTE — Progress Notes (Signed)
Chief Complaint: Diarrhea, abdominal pain and bloating  HPI:    [name] is an 86 year old male with a past medical history as listed below including C. difficile, CAD, GERD, lymphocytic colitis, lymphocytic gastritis, cardiomyopathy with an EF of 20-25%, and multiple others, known to Dr. Leone Payor, who presents to clinic today with a complaint of abdominal pain bloating and diarrhea.    02/28/2010 EGD with findings of moderate gastritis in antrum and diverticulum in the pyloric channel and otherwise normal.    02/24/2010 colonoscopy with nodular mucosa in the TI, abnormal mucosa in the right and left colon and path erythema and loss of vascular pattern, biopsy showed lymphocytic colitis.    04/03/2016 office visit with me for left upper quadrant pain, this was occurring immediately after eating certain foods.  At that time increase patient's Omeprazole to 40 mg daily and instructed him to use his Zantac 150 mg at night before going to bed.    06/03/2016 EGD with nonbleeding gastric ulcer with no stigmata of bleeding, no H. pylori, chronic gastritis and otherwise normal.    08/03/2019 office visit with Dr. Leone Payor left upper quadrant pain follow-up.  At that time it had improved/resolved.  He related to getting antibiotics for pacemaker exchange recently.  Had a CT scan which was unrevealing.  At that time discussed some trouble swallowing and further work-up was ordered with a barium swallow and tablet.  It is noted he had bad cardiomyopathy and was dyspneic just moving around the exam room.  Discussed if he needed an EGD he would needed in the hospital.  CT had shown some chronic interstitial lung disease and is recommended he see Dr. Everlene Other about this.    08/11/2019 esophagram with no abnormality that time was discussed that his esophagus was okay and he did not need further testing.  He was told to cut food small and chew well, eat slowly and follow with liquids.    03/29/2020 patient had a pacemaker with  defibrillator placed.    10/08/2020 patient seen in clinic and described left-sided abdominal pain which that had come back over the past 2 to 3 weeks.  He was taking his Omeprazole 40 mg as needed for 2 weeks with no change in symptoms.  That time discussed this is chronic left-sided abdominal pain, previous CT with no acute findings.  Ordered repeat CT of the abdomen pelvis with contrast and prescribed Hyoscyamine every 6 hours as needed.  We discussed he may be possibly musculoskeletal at that point as he was lifting his wife with Alzheimer's at home.    10/23/2020 CT the abdomen pelvis with extensive sigmoid diverticulosis without signs of acute diverticulitis, umbilical hernia containing fat, bilateral kidney cysts and aortic atherosclerosis.  At that time patient was asked if the Levsin helped and he said it helped a little bit.  He declined a referral to Dr. Ayesha Mohair.    08/09/2021 CBC and CMP normal.    Today, the patient presents to clinic with a complaint of diarrhea.  Tells me that for the past 2 to 3 weeks anything he eats goes "right through me".  It would either happen later in the day or the next morning.  Also anytime he eats he gets very bloated.  He did stop his veggies and iron for a week to see if this helped at all but it did not.  He has had occasional twinges of a mid left-sided abdominal pain.  Tells me he had 1 solid stool  over this timeframe.  No fever, chills or hematochezia.    His wife passed away in 02-22-2022, he and his daughter are going on a trip to [place] soon.    Denies weight loss.   Past Medical History:  Diagnosis Date   C. difficile colitis 2011   relapsed x 1   CAD (coronary artery disease)    GERD (gastroesophageal reflux disease)    Hip dislocation, right (HCC)    Hyperlipemia    Hypertension    Lymphocytic colitis    Lymphocytic gastritis    Myocardial infarction (HCC) 2008    Peptic ulcer disease    Thrombocytopenia (HCC)     Past Surgical History:   Procedure Laterality Date   APPENDECTOMY     ARTHROPLASTY Left    cataract surgery Bilateral 11/2015   COLONOSCOPY  Multiple   CORONARY ANGIOPLASTY WITH STENT PLACEMENT  2008, 2009   DES x 2   FEMORAL HERNIA REPAIR     bilateral   HEMORRHOID SURGERY     INGUINAL HERNIA REPAIR     bilateral   PACEMAKER GENERATOR CHANGE  11/2017   PACEMAKER INSERTION  2003   RETINAL LASER PROCEDURE  08/31/2016   TONSILLECTOMY     TOTAL HIP ARTHROPLASTY     Bilateral    TOTAL HIP REVISION  2011   left   UPPER GASTROINTESTINAL ENDOSCOPY  Multiple    Current Outpatient Medications  Medication Sig Dispense Refill   acetaminophen (TYLENOL) 650 MG CR tablet Take 1,300 mg by mouth every 8 (eight) hours as needed for pain.     Ascorbic Acid (VITAMIN C) 1000 MG tablet Take 1,000 mg by mouth daily.     aspirin 81 MG tablet Take 81 mg by mouth daily.       atorvastatin (LIPITOR) 40 MG tablet Take 40 mg by mouth daily.       calcium-vitamin D (OSCAL WITH D) 250-125 MG-UNIT per tablet Take 1 tablet by mouth 2 (two) times daily.      chlorthalidone (HYGROTON) 25 MG tablet Take 25 mg by mouth daily.     Collagen Hydrolysate POWD 2 Scoops by Does not apply route daily as needed.     magnesium gluconate (MAGONATE) 500 MG tablet Take 500 mg by mouth 2 (two) times daily.     metoprolol succinate (TOPROL-XL) 100 MG 24 hr tablet Take 1 tablet by mouth daily.     Multiple Vitamins-Minerals (CENTRUM PO) Take 1 capsule by mouth daily.     nitroGLYCERIN (NITROSTAT) 0.6 MG SL tablet Place 0.6 mg under the tongue every 5 (five) minutes as needed.       omeprazole (PRILOSEC) 40 MG capsule Take 1 capsule (40 mg total) by mouth daily. 90 capsule 3   vitamin B-12 (CYANOCOBALAMIN) 1000 MCG tablet Take 1,000 mcg by mouth daily.     zinc gluconate 50 MG tablet Take 50 mg by mouth daily.     hyoscyamine (LEVSIN SL) 0.125 MG SL tablet Place 1 tablet (0.125 mg total) under the tongue every 6 (six) hours as needed. (Patient not  taking: Reported on 10/24/2021) 15 tablet 1   No current facility-administered medications for this visit.    Allergies as of 10/24/2021 - Review Complete 10/24/2021  Allergen Reaction Noted   Chocolate flavor Rash 08/17/2013   Cranberry  01/08/2011   Lisinopril Cough 05/27/2016   Niacin      Family History  Problem Relation Age of Onset   Breast cancer Sister  Heart attack Brother        x2    Diabetes Sister    Hypertension Sister    Heart disease Brother    Heart disease Mother    Colon cancer Neg Hx    Colon polyps Neg Hx     Social History   Socioeconomic History   Marital status: Married    Spouse name: Not on file   Number of children: 2   Years of education: Not on file   Highest education level: Not on file  Occupational History   Occupation: retired  Tobacco Use   Smoking status: Former   Smokeless tobacco: Never   Tobacco comments:    quit 1970  Vaping Use   Vaping Use: Never used  Substance and Sexual Activity   Alcohol use: No   Drug use: No   Sexual activity: Not Currently    Partners: Female  Other Topics Concern   Not on file  Social History Narrative   Married, 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   Social Determinants of Health   Financial Resource Strain: Not on file  Food Insecurity: Not on file  Transportation Needs: Not on file  Physical Activity: Not on file  Stress: Not on file  Social Connections: Not on file  Intimate Partner Violence: Not on file    Review of Systems:    Constitutional: No weight loss, fever or chills Cardiovascular: No chest pain Respiratory: No SOB  Gastrointestinal: See HPI and otherwise negative   Physical Exam:  Vital signs: BP 108/68   Pulse 82   Ht 5\' 8"  (1.727 m)   Wt 189 lb 8 oz (86 kg)   BMI 28.81 kg/m    Constitutional:   Pleasant AA male appears to be in NAD, Well developed, Well nourished, alert and cooperative  Respiratory: Respirations even and unlabored. Lungs clear to  auscultation bilaterally.   No wheezes, crackles, or rhonchi.  Cardiovascular: Normal S1, S2. No MRG. Regular rate and rhythm. No peripheral edema, cyanosis or pallor.  Gastrointestinal:  Soft, nondistended, nontender. No rebound or guarding. Normal bowel sounds. No appreciable masses or hepatomegaly. Rectal:  Not performed.  Psychiatric: Oriented to person, place and time. Demonstrates good judgement and reason without abnormal affect or behaviors.  No recent labs or imaging.  Assessment: 1.  Diarrhea: Change to loose stool over the past 2 to 3 weeks which occur pretty much anytime he eats anything sometimes 2-3 times a day, previous history of lymphocytic colitis; consider lymphocytic colitis versus infectious cause versus medication side effect from Magnesium 2.  Bloating: With above  Plan: 1.  Ordered stool studies including GI pathogen panel, O&P, fecal pancreatic elastase, fecal lactoferrin and calprotectin 2.  Pending results from above would recommend a course of Budesonide for his known lymphocytic colitis. 3.  Patient to follow in clinic per recommendations after testing above.  Ellouise Newer, PA-C [facility] 10/24/2021, 9:59 AM  Cc: Bernerd Limbo, MD

## 2021-10-24 NOTE — Patient Instructions (Addendum)
If you are age 84 or older, your body mass index should be between 23-30. Your Body mass index is 28.81 kg/m. If this is out of the aforementioned range listed, please consider follow up with your Primary Care Provider.  If you are age 63 or younger, your body mass index should be between 19-25. Your Body mass index is 28.81 kg/m. If this is out of the aformentioned range listed, please consider follow up with your Primary Care Provider.   ________________________________________________________  The [facility] providers would like to encourage you to use [facility] to communicate with providers for non-urgent requests or questions.  Due to long hold times on the telephone, sending your provider a message by [facility] may be a faster and more efficient way to get a response.  Please allow 48 business hours for a response.  Please remember that this is for non-urgent requests.  _______________________________________________________   Your provider has requested that you go to the basement level for lab work before leaving today. Press "B" on the elevator. The lab is located at the first door on the left as you exit the elevator.   Due to recent changes in healthcare laws, you may see the results of your imaging and laboratory studies on MyChart before your provider has had a chance to review them.  We understand that in some cases there may be results that are confusing or concerning to you. Not all laboratory results come back in the same time frame and the provider may be waiting for multiple results in order to interpret others.  Please give [place] 48 hours in order for your provider to thoroughly review all the results before contacting the office for clarification of your results.    It was a pleasure to see you today!  Thank you for trusting me with your gastrointestinal care!

## 2021-10-27 ENCOUNTER — Other Ambulatory Visit: Payer: Medicare HMO

## 2021-10-27 DIAGNOSIS — R140 Abdominal distension (gaseous): Secondary | ICD-10-CM

## 2021-10-27 DIAGNOSIS — R197 Diarrhea, unspecified: Secondary | ICD-10-CM

## 2021-10-30 ENCOUNTER — Other Ambulatory Visit: Payer: Self-pay

## 2021-10-30 MED ORDER — AZITHROMYCIN 500 MG PO TABS: 500.0000 mg | ORAL_TABLET | Freq: Every day | ORAL | 0 refills | Status: AC

## 2021-10-31 LAB — GI PROFILE, STOOL, PCR
Adenovirus F 40/41: NOT DETECTED
Astrovirus: NOT DETECTED
C difficile toxin A/B: NOT DETECTED
Campylobacter: NOT DETECTED
Cryptosporidium: NOT DETECTED
Cyclospora cayetanensis: NOT DETECTED
Entamoeba histolytica: NOT DETECTED
Enteroaggregative E coli: NOT DETECTED
Enteropathogenic E coli: NOT DETECTED
Enterotoxigenic E coli: DETECTED — AB
Giardia lamblia: NOT DETECTED
Norovirus GI/GII: DETECTED — AB
Plesiomonas shigelloides: NOT DETECTED
Rotavirus A: NOT DETECTED
Salmonella: NOT DETECTED
Sapovirus: NOT DETECTED
Shiga-toxin-producing E coli: NOT DETECTED
Shigella/Enteroinvasive E coli: NOT DETECTED
Vibrio cholerae: NOT DETECTED
Vibrio: NOT DETECTED
Yersinia enterocolitica: NOT DETECTED

## 2021-10-31 LAB — CALPROTECTIN, FECAL: Calprotectin, Fecal: 74 ug/g (ref 0–120)

## 2021-11-08 LAB — FECAL LACTOFERRIN, QUANT
Fecal Lactoferrin: NEGATIVE
MICRO NUMBER:: 13962915
SPECIMEN QUALITY:: ADEQUATE

## 2021-11-08 LAB — OVA AND PARASITE EXAMINATION
CONCENTRATE RESULT:: NONE SEEN
MICRO NUMBER:: 13962840
SPECIMEN QUALITY:: ADEQUATE
TRICHROME RESULT:: NONE SEEN

## 2021-11-08 LAB — PANCREATIC ELASTASE, FECAL: Pancreatic Elastase-1, Stool: >500 mcg/g

## 2022-01-29 IMAGING — RF DG ESOPHAGUS
5 series · 20 of 20 positions shown · non-contrast
Comparison: None.

CLINICAL DATA: Dysphagia.

EXAM:
ESOPHOGRAM / BARIUM SWALLOW / BARIUM TABLET STUDY
TECHNIQUE: Combined double contrast and single contrast examination performed
using effervescent crystals, thick barium liquid, and thin barium
liquid. The patient was observed with fluoroscopy swallowing a 13 mm
barium sulphate tablet.
FLUOROSCOPY TIME:  Radiation Exposure Index (if provided by the
fluoroscopic device): 26 mGy.

[Series 1: cp_standard · 0.52mm/px · 4 of 168 frames shown (1 of 5)]
[frame 26/168]
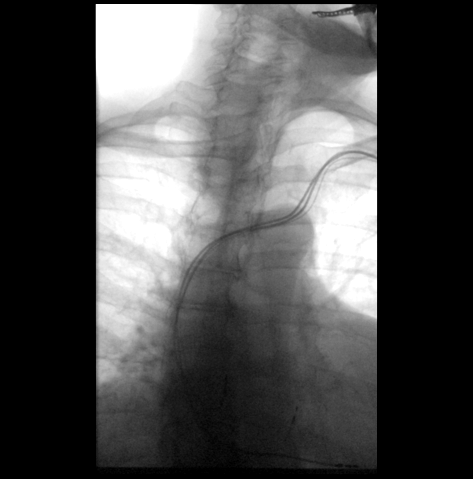
[frame 60/168]
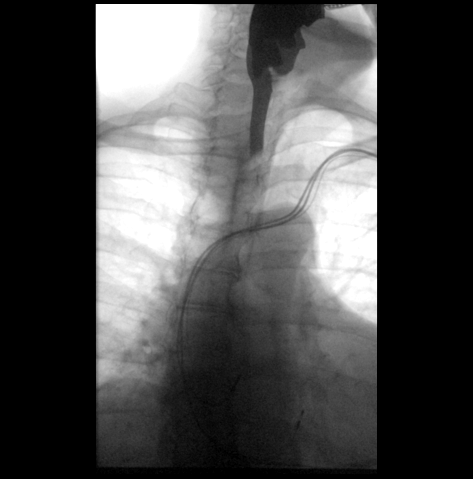
[frame 85/168]
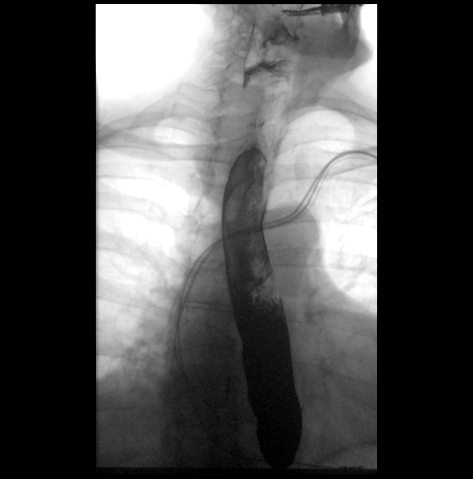
[frame 143/168]
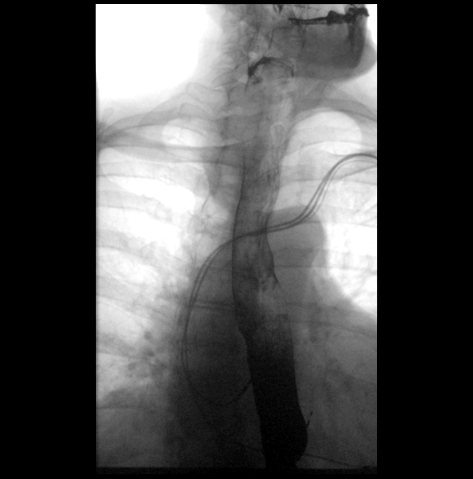

[Series 2: cp_standard · 0.52mm/px · 4 of 241 frames shown (2 of 5)]
[frame 1/241]
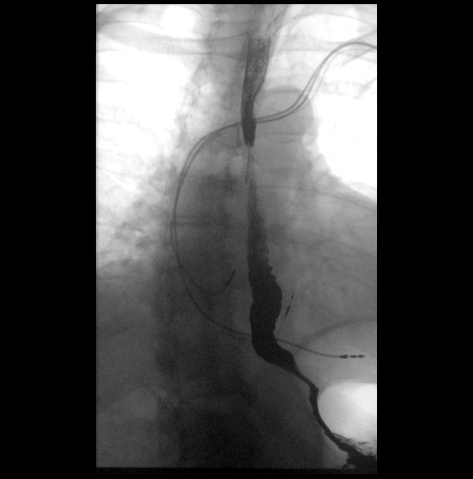
[frame 37/241]
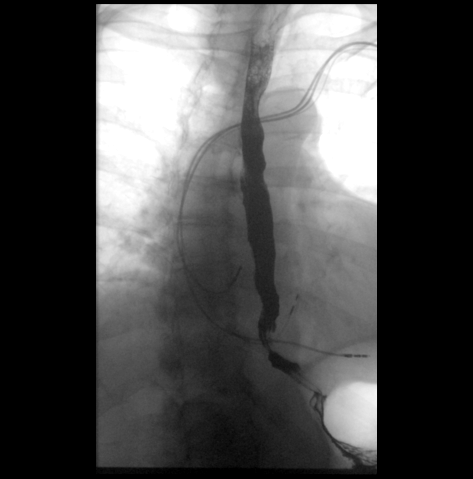
[frame 121/241]
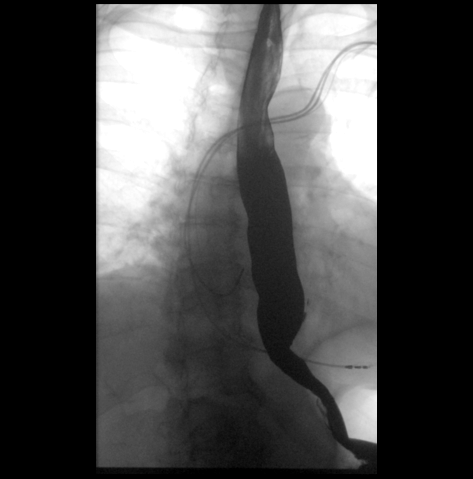
[frame 205/241]
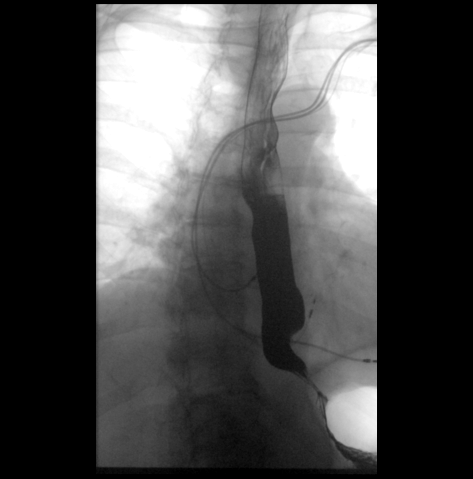

[Series 3: cp_standard · 0.52mm/px · 4 of 110 frames shown (3 of 5)]
[frame 17/110]
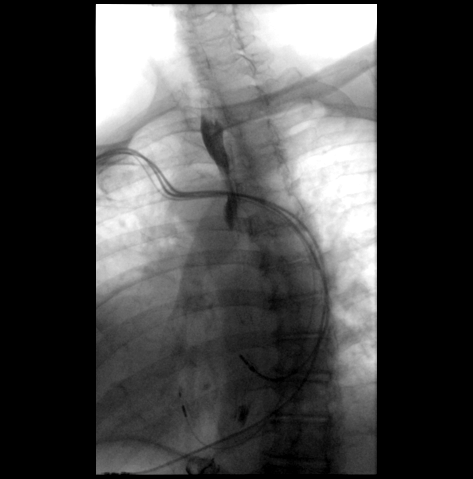
[frame 23/110]
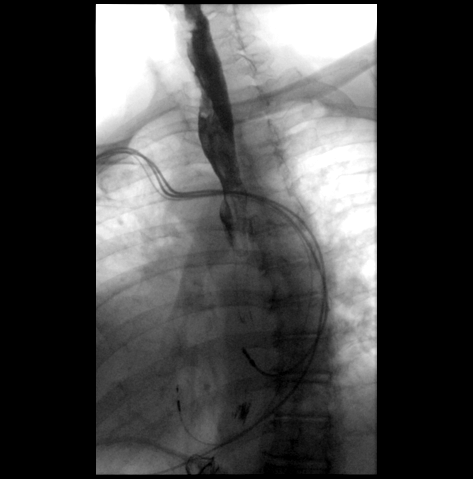
[frame 56/110]
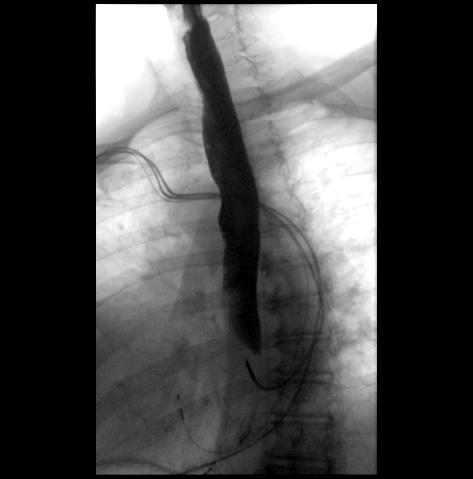
[frame 94/110]
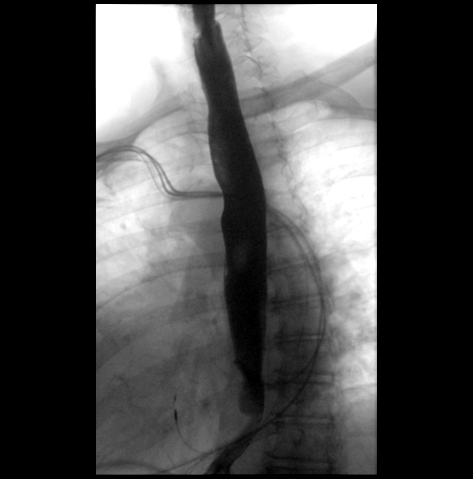

[Series 4: cp_standard · 0.52mm/px · 4 of 231 frames shown (4 of 5)]
[frame 35/231]
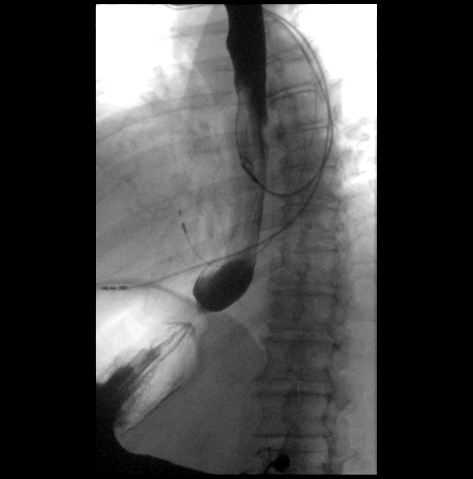
[frame 53/231]
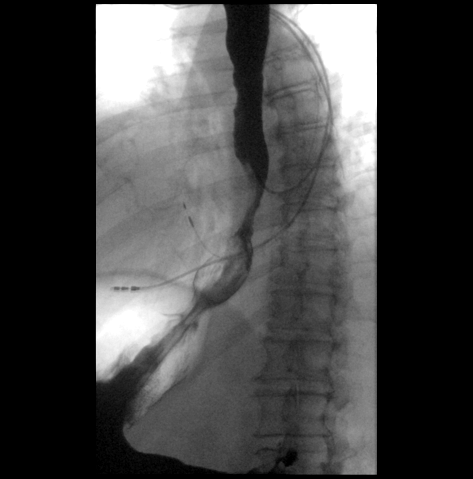
[frame 116/231]
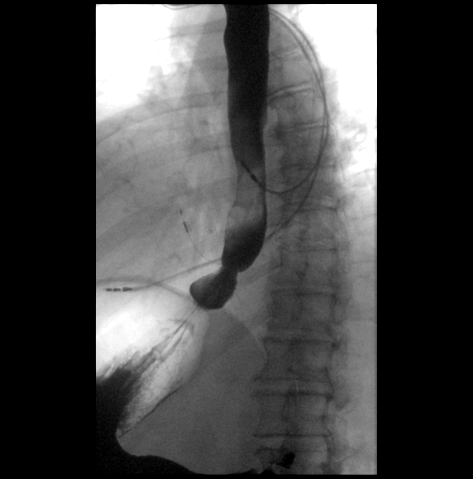
[frame 197/231]
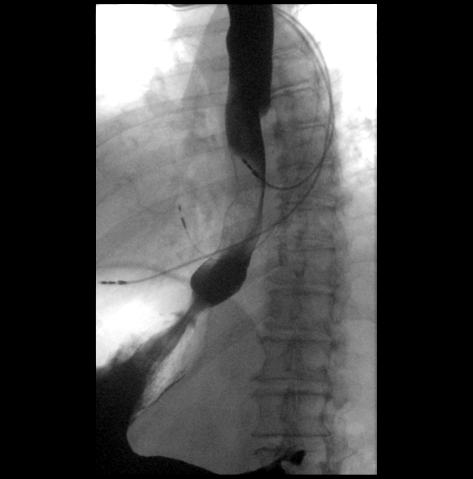

[Series 5: cp_standard · 0.52mm/px · 4 of 173 frames shown (5 of 5)]
[frame 26/173]
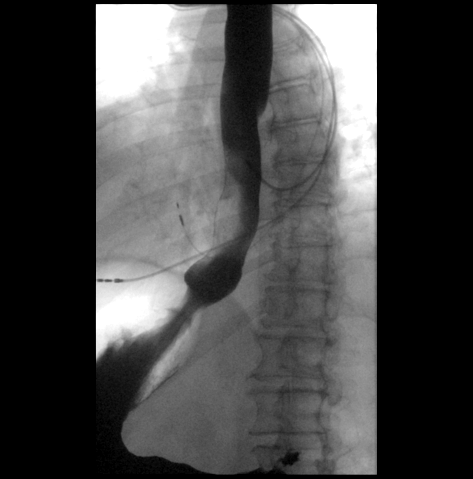
[frame 87/173]
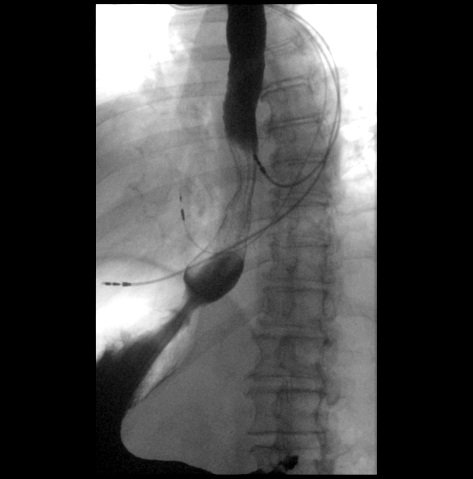
[frame 148/173]
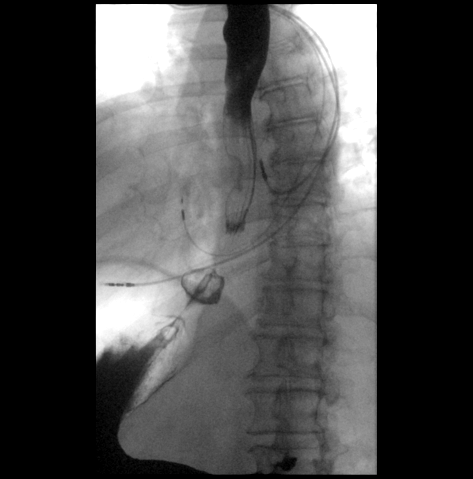
[frame 159/173]
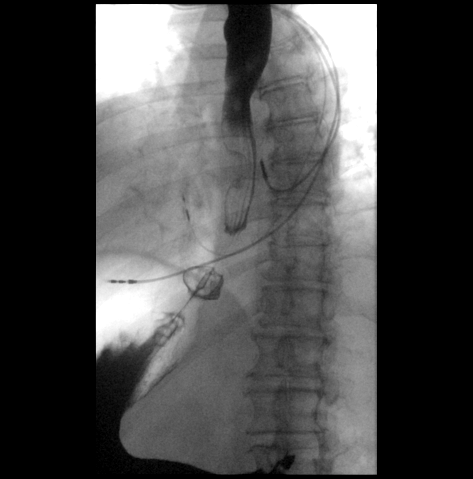

[20 of 20 positions shown; findings below may reference images not displayed]

FINDINGS: No mass or stricture is noted in the esophagus. No hiatal hernia or
reflux is noted. Barium tablet passed through esophagus and into
stomach without difficulty or delay.
IMPRESSION: No abnormality seen in the esophagus.

## 2022-08-20 ENCOUNTER — Telehealth: Payer: Self-pay | Admitting: Physician Assistant

## 2022-08-20 NOTE — Telephone Encounter (Signed)
Inbound call from patient stating he is experiencing a significant amount of bloating. Also states his food is having a very hard time digesting. States this has been happening for about a month. Scheduled for next available 10/2. Requesting a call back to discuss if anything can help prior to appointment. Please advise, thank you.

## 2022-08-20 NOTE — Telephone Encounter (Signed)
Returned call to patient. Pt reports that he has constant bloating no matter what he eats. The only thing that helps is him taking 4-5 Tums a day. I told pt that he could try Gas-X to see if that would help with the bloating. Pt states that he has been avoiding foods that are gaseous. Pt thinks he may need a procedure, pt is aware that he will need an office visit for evaluation first. Pt also reports feeling like foods takes a while to digest, he can feel food sitting in his chest. Pt's appt has been rescheduled to 08/27/22 at 11:30 am with Hyacinth Meeker, PA-C. Pt verbalized understanding and had no concerns at the end of the call.

## 2022-08-26 ENCOUNTER — Ambulatory Visit: Payer: Medicare HMO | Admitting: Physician Assistant

## 2022-08-26 ENCOUNTER — Encounter: Payer: Self-pay | Admitting: Physician Assistant

## 2022-08-26 VITALS — BP 120/70 | HR 82 | Ht 68.0 in | Wt 194.0 lb

## 2022-08-26 DIAGNOSIS — K219 Gastro-esophageal reflux disease without esophagitis: Secondary | ICD-10-CM

## 2022-08-26 DIAGNOSIS — R140 Abdominal distension (gaseous): Secondary | ICD-10-CM | POA: Diagnosis not present

## 2022-08-26 MED ORDER — PANTOPRAZOLE SODIUM 40 MG PO TBEC: 40.0000 mg | DELAYED_RELEASE_TABLET | Freq: Two times a day (BID) | ORAL | 3 refills | Status: DC

## 2022-08-26 NOTE — Progress Notes (Signed)
Chief Complaint: Bloating/gas and reflux  HPI:    [name] is an 87 year old African-American male with a past medical history as listed below including CAD, known to Dr. Leone Payor, who was referred to me by Tracey Harries, MD for a complaint of bloating, gas and reflux.    02/28/2010 EGD with findings of moderate gastritis in antrum and diverticulum in the pyloric channel and otherwise normal.    02/24/2010 colonoscopy with nodular mucosa in the TI, abnormal mucosa in the right and left colon and path erythema and loss of vascular pattern, biopsy showed lymphocytic colitis.    04/03/2016 office visit with me for left upper quadrant pain, this was occurring immediately after eating certain foods.  At that time increase patient's Omeprazole to 40 mg daily and instructed him to use his Zantac 150 mg at night before going to bed.    06/03/2016 EGD with nonbleeding gastric ulcer with no stigmata of bleeding, no H. pylori, chronic gastritis and otherwise normal.    08/03/2019 office visit with Dr. Leone Payor left upper quadrant pain follow-up.  At that time it had improved/resolved.  He related to getting antibiotics for pacemaker exchange recently.  Had a CT scan which was unrevealing.  At that time discussed some trouble swallowing and further work-up was ordered with a barium swallow and tablet.  It is noted he had bad cardiomyopathy and was dyspneic just moving around the exam room.  Discussed if he needed an EGD he would needed in the hospital.  CT had shown some chronic interstitial lung disease and is recommended he see Dr. Everlene Other about this.    08/11/2019 esophagram with no abnormality that time was discussed that his esophagus was okay and he did not need further testing.  He was told to cut food small and chew well, eat slowly and follow with liquids.    03/29/2020 patient had a pacemaker with defibrillator placed.    10/08/2020 patient seen in clinic and described left-sided abdominal pain which that had come back  over the past 2 to 3 weeks.  He was taking his Omeprazole 40 mg as needed for 2 weeks with no change in symptoms.  That time discussed this is chronic left-sided abdominal pain, previous CT with no acute findings.  Ordered repeat CT of the abdomen pelvis with contrast and prescribed Hyoscyamine every 6 hours as needed.  We discussed he may be possibly musculoskeletal at that point as he was lifting his wife with Alzheimer's at home.    10/23/2020 CT the abdomen pelvis with extensive sigmoid diverticulosis without signs of acute diverticulitis, umbilical hernia containing fat, bilateral kidney cysts and aortic atherosclerosis.  At that time patient was asked if the Levsin helped and he said it helped a little bit.  He declined a referral to Dr. Ayesha Mohair.    08/09/2021 CBC and CMP normal.    10/24/2021 patient was seen by me and complained of diarrhea and bloating.  At that time ordered stool studies and he was found to have E. coli and treated with Azithromycin.    08/22/2022 CBC was normal.  CMP with a glucose elevated at 105 and BUN minimally elevated at 28 and otherwise normal.    Today, patient presents to clinic accompanied by his family member.  He tells me he has been having a lot of food feel like it comes back up into his throat and liquid especially at night and has been chewing a lot of Tums to help with this.  Currently using Omeprazole 40 mg daily but does not feel like it is working for him anymore as he has been on this medicine for years.  Associated symptoms include bloating and gas.    Denies fever, chills or weight loss.  Past Medical History:  Diagnosis Date   C. difficile colitis 2011   relapsed x 1   CAD (coronary artery disease)    GERD (gastroesophageal reflux disease)    Hip dislocation, right (HCC)    Hyperlipemia    Hypertension    Lymphocytic colitis    Lymphocytic gastritis    Myocardial infarction (HCC) 2008    Peptic ulcer disease    Thrombocytopenia (HCC)     Past  Surgical History:  Procedure Laterality Date   APPENDECTOMY     ARTHROPLASTY Left    cataract surgery Bilateral 11/2015   COLONOSCOPY  Multiple   CORONARY ANGIOPLASTY WITH STENT PLACEMENT  2008, 2009   DES x 2   FEMORAL HERNIA REPAIR     bilateral   HEMORRHOID SURGERY     INGUINAL HERNIA REPAIR     bilateral   PACEMAKER GENERATOR CHANGE  11/2017   PACEMAKER INSERTION  2003   RETINAL LASER PROCEDURE  08/31/2016   TONSILLECTOMY     TOTAL HIP ARTHROPLASTY     Bilateral    TOTAL HIP REVISION  2011   left   UPPER GASTROINTESTINAL ENDOSCOPY  Multiple    Current Outpatient Medications  Medication Sig Dispense Refill   acetaminophen (TYLENOL) 650 MG CR tablet Take 1,300 mg by mouth every 8 (eight) hours as needed for pain.     Ascorbic Acid (VITAMIN C) 1000 MG tablet Take 1,000 mg by mouth daily.     aspirin 81 MG tablet Take 81 mg by mouth daily.       atorvastatin (LIPITOR) 40 MG tablet Take 40 mg by mouth daily.       calcium-vitamin D (OSCAL WITH D) 250-125 MG-UNIT per tablet Take 1 tablet by mouth 2 (two) times daily.      chlorthalidone (HYGROTON) 25 MG tablet Take 25 mg by mouth daily.     Collagen Hydrolysate POWD 2 Scoops by Does not apply route daily as needed.     ferrous gluconate (FERGON) 324 MG tablet Take 324 mg by mouth daily with breakfast.     gabapentin (NEURONTIN) 100 MG capsule Take 100 mg by mouth 3 (three) times daily.     isosorbide mononitrate (IMDUR) 30 MG 24 hr tablet Take 30 mg by mouth daily.     losartan (COZAAR) 25 MG tablet Take 0.5 tablets by mouth daily.     magnesium gluconate (MAGONATE) 500 MG tablet Take 500 mg by mouth 2 (two) times daily.     meloxicam (MOBIC) 7.5 MG tablet Take 7.5 mg by mouth daily.     metoprolol succinate (TOPROL-XL) 100 MG 24 hr tablet Take 1 tablet by mouth daily.     Multiple Vitamins-Minerals (CENTRUM PO) Take 1 capsule by mouth daily.     nitroGLYCERIN (NITROSTAT) 0.6 MG SL tablet Place 0.6 mg under the tongue every 5  (five) minutes as needed.       omeprazole (PRILOSEC) 40 MG capsule Take 1 capsule (40 mg total) by mouth daily. 90 capsule 3   ranolazine (RANEXA) 500 MG 12 hr tablet Take 500 mg by mouth 2 (two) times daily.     spironolactone (ALDACTONE) 25 MG tablet Take 12.5 mg by mouth daily.     vitamin B-12 (  CYANOCOBALAMIN) 1000 MCG tablet Take 1,000 mcg by mouth daily.     zinc gluconate 50 MG tablet Take 50 mg by mouth daily.     No current facility-administered medications for this visit.    Allergies as of 08/26/2022 - Review Complete 08/26/2022  Allergen Reaction Noted   Chocolate flavor Rash 08/17/2013   Cranberry  01/08/2011   Lisinopril Cough 05/27/2016   Niacin      Family History  Problem Relation Age of Onset   Breast cancer Sister    Heart attack Brother        x2    Diabetes Sister    Hypertension Sister    Heart disease Brother    Heart disease Mother    Colon cancer Neg Hx    Colon polyps Neg Hx     Social History   Socioeconomic History   Marital status: Married    Spouse name: Not on file   Number of children: 2   Years of education: Not on file   Highest education level: Not on file  Occupational History   Occupation: retired  Tobacco Use   Smoking status: Former   Smokeless tobacco: Never   Tobacco comments:    quit 1970  Vaping Use   Vaping status: Never Used  Substance and Sexual Activity   Alcohol use: No   Drug use: No   Sexual activity: Not Currently    Partners: Female  Other Topics Concern   Not on file  Social History Narrative   Married, retired   Engineer, 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   Social Determinants of Health   Financial Resource Strain: Low Risk  (08/17/2022)   Received from [facility]   Overall Financial Resource Strain (CARDIA)    Difficulty of Paying Living Expenses: Not hard at all  Food Insecurity: No Food Insecurity (08/17/2022)   Received from [facility]   Hunger Vital Sign    Worried About Running Out of Food in the  Last Year: Never true    Ran Out of Food in the Last Year: Never true  Transportation Needs: No Transportation Needs (08/17/2022)   Received from [facility] - Transportation    Lack of Transportation (Medical): No    Lack of Transportation (Non-Medical): No  Physical Activity: Insufficiently Active (08/17/2022)   Received from [facility]   Exercise Vital Sign    Days of Exercise per Week: 3 days    Minutes of Exercise per Session: 20 min  Stress: No Stress Concern Present (08/17/2022)   Received from [facility] - Occupational Stress Questionnaire    Feeling of Stress : Not at all  Social Connections: Unknown (08/20/2022)   Received from [facility]   Social Connection and Isolation Panel [NHANES]    Frequency of Communication with Friends and Family: Not on file    Frequency of Social Gatherings with Friends and Family: Not on file    Attends Religious Services: Not on file    Active Member of Clubs or Organizations: Not on file    Attends Banker Meetings: Not on file    Marital Status: Widowed  Intimate Partner Violence: Not At Risk (08/20/2022)   Received from [facility]   Humiliation, Afraid, Rape, and Kick questionnaire    Fear of Current or Ex-Partner: No    Emotionally Abused: No    Physically Abused: No    Sexually Abused: No    Review  of Systems:    Constitutional: No weight loss, fever or chills Cardiovascular: No chest pain   Respiratory: No SOB  Gastrointestinal: See HPI and otherwise negative   Physical Exam:  Vital signs: BP 120/70   Pulse 82   Ht 5\' 8"  (1.727 m)   Wt 194 lb (88 kg)   BMI 29.50 kg/m    Constitutional:   Pleasant elderly AA male appears to be in NAD, Well developed, Well nourished, alert and cooperative Respiratory: Respirations even and unlabored. Lungs clear to auscultation bilaterally.   No wheezes, crackles, or rhonchi.  Cardiovascular: Normal S1, S2. No MRG.  Regular rate and rhythm. No peripheral edema, cyanosis or pallor.  Gastrointestinal:  Soft, nondistended, nontender. No rebound or guarding. Normal bowel sounds. No appreciable masses or hepatomegaly. Psychiatric: Demonstrates good judgement and reason without abnormal affect or behaviors.  See HPI for recent labs.  Assessment: 1.  GERD: With bloating and eructations as well as reflux of acid on Omeprazole 40 mg daily; likely ongoing gastritis  Plan: 1.  At this point will stop Omeprazole.  Started Pantoprazole 40 mg twice daily, 30-60 minutes before breakfast and dinner.  #180 with 3 refills sent to patient's mail order pharmacy. 2.  Discussed with patient that I would prefer not to do procedures on him given his increased risk with cardiac status and blood thinner etc. We will trial medications first.  He verbalized understanding. 3.  Patient to follow in clinic with me in 2 to 3 months or sooner if necessary.  Hyacinth Meeker, PA-C [facility] 08/26/2022, 11:30 AM  Cc: Tracey Harries, MD

## 2022-08-26 NOTE — Patient Instructions (Signed)
We have sent the following medications to your pharmacy for you to pick up at your convenience: Pantoprazole 40 mg    STOP Omeprazole   _______________________________________________________  If your blood pressure at your visit was 140/90 or greater, please contact your primary care physician to follow up on this.  _______________________________________________________  If you are age 85 or older, your body mass index should be between 23-30. Your Body mass index is 29.5 kg/m. If this is out of the aforementioned range listed, please consider follow up with your Primary Care Provider.  If you are age 70 or younger, your body mass index should be between 19-25. Your Body mass index is 29.5 kg/m. If this is out of the aformentioned range listed, please consider follow up with your Primary Care Provider.   ________________________________________________________  The [place] [facility] providers would like to encourage you to use [facility] to communicate with providers for non-urgent requests or questions.  Due to long hold times on the telephone, sending your provider a message by [facility] may be a faster and more efficient way to get a response.  Please allow 48 business hours for a response.  Please remember that this is for non-urgent requests.  _______________________________________________________   I appreciate the  opportunity to care for you  Thank You   Jacelyn Grip

## 2022-11-04 ENCOUNTER — Ambulatory Visit: Payer: Medicare HMO | Admitting: Physician Assistant

## 2022-11-13 ENCOUNTER — Ambulatory Visit: Payer: Medicare HMO | Admitting: Physician Assistant

## 2022-11-13 ENCOUNTER — Encounter: Payer: Self-pay | Admitting: Physician Assistant

## 2022-11-13 VITALS — BP 118/68 | HR 80 | Ht 68.0 in | Wt 197.0 lb

## 2022-11-13 DIAGNOSIS — R140 Abdominal distension (gaseous): Secondary | ICD-10-CM

## 2022-11-13 DIAGNOSIS — K219 Gastro-esophageal reflux disease without esophagitis: Secondary | ICD-10-CM

## 2022-11-13 NOTE — Progress Notes (Signed)
Chief Complaint: Follow-up GERD and bloating  HPI:    [name] is an 87 year old male with a past medical history as listed below including CAD, known to Dr. Leone Payor, who returns to clinic today for follow-up of his reflux, bloating and gas.      02/28/2010 EGD with findings of moderate gastritis in antrum and diverticulum in the pyloric channel and otherwise normal.    02/24/2010 colonoscopy with nodular mucosa in the TI, abnormal mucosa in the right and left colon and path erythema and loss of vascular pattern, biopsy showed lymphocytic colitis.    04/03/2016 office visit with me for left upper quadrant pain, this was occurring immediately after eating certain foods.  At that time increase patient's Omeprazole to 40 mg daily and instructed him to use his Zantac 150 mg at night before going to bed.    06/03/2016 EGD with nonbleeding gastric ulcer with no stigmata of bleeding, no H. pylori, chronic gastritis and otherwise normal.    08/03/2019 office visit with Dr. Leone Payor left upper quadrant pain follow-up.  At that time it had improved/resolved.  He related to getting antibiotics for pacemaker exchange recently.  Had a CT scan which was unrevealing.  At that time discussed some trouble swallowing and further work-up was ordered with a barium swallow and tablet.  It is noted he had bad cardiomyopathy and was dyspneic just moving around the exam room.  Discussed if he needed an EGD he would needed in the hospital.  CT had shown some chronic interstitial lung disease and is recommended he see Dr. Everlene Other about this.    08/11/2019 esophagram with no abnormality that time was discussed that his esophagus was okay and he did not need further testing.  He was told to cut food small and chew well, eat slowly and follow with liquids.    03/29/2020 patient had a pacemaker with defibrillator placed.    10/08/2020 patient seen in clinic and described left-sided abdominal pain which that had come back over the past 2 to 3  weeks.  He was taking his Omeprazole 40 mg as needed for 2 weeks with no change in symptoms.  That time discussed this is chronic left-sided abdominal pain, previous CT with no acute findings.  Ordered repeat CT of the abdomen pelvis with contrast and prescribed Hyoscyamine every 6 hours as needed.  We discussed he may be possibly musculoskeletal at that point as he was lifting his wife with Alzheimer's at home.    10/23/2020 CT the abdomen pelvis with extensive sigmoid diverticulosis without signs of acute diverticulitis, umbilical hernia containing fat, bilateral kidney cysts and aortic atherosclerosis.  At that time patient was asked if the Levsin helped and he said it helped a little bit.  He declined a referral to Dr. Lewie Loron.    08/09/2021 CBC and CMP normal.    10/24/2021 patient was seen by me and complained of diarrhea and bloating.  At that time ordered stool studies and he was found to have E. coli and treated with Azithromycin.    08/22/2022 CBC was normal.  CMP with a glucose elevated at 105 and BUN minimally elevated at 28 and otherwise normal.    08/26/2022 patient seen in clinic by me and at that time described that food felt like it came back up into his throat at times.  He was using Omeprazole 40 mg daily but did not feel like it was working as well as some bloating and gas.  At that  point change patient to Pantoprazole 40 mg twice a day.    Today, the patient presents clinic accompanied by his daughter and tells me that he feels perfectly well now.  He continues his Pantoprazole 40 mg twice a day with no further reflux, bloating or gas.    Headed to [place] for a church event the next week.    Denies fever, chills, abdominal pain or weight loss.  Past Medical History:  Diagnosis Date   C. difficile colitis 2011   relapsed x 1   CAD (coronary artery disease)    GERD (gastroesophageal reflux disease)    Hip dislocation, right (HCC)    Hyperlipemia    Hypertension    Lymphocytic  colitis    Lymphocytic gastritis    Myocardial infarction (HCC) 2008    Peptic ulcer disease    Thrombocytopenia (HCC)     Past Surgical History:  Procedure Laterality Date   APPENDECTOMY     ARTHROPLASTY Left    cataract surgery Bilateral 11/2015   COLONOSCOPY  Multiple   CORONARY ANGIOPLASTY WITH STENT PLACEMENT  2008, 2009   DES x 2   FEMORAL HERNIA REPAIR     bilateral   HEMORRHOID SURGERY     INGUINAL HERNIA REPAIR     bilateral   PACEMAKER GENERATOR CHANGE  11/2017   PACEMAKER INSERTION  2003   RETINAL LASER PROCEDURE  08/31/2016   TONSILLECTOMY     TOTAL HIP ARTHROPLASTY     Bilateral    TOTAL HIP REVISION  2011   left   UPPER GASTROINTESTINAL ENDOSCOPY  Multiple    Current Outpatient Medications  Medication Sig Dispense Refill   acetaminophen (TYLENOL) 650 MG CR tablet Take 1,300 mg by mouth every 8 (eight) hours as needed for pain.     Ascorbic Acid (VITAMIN C) 1000 MG tablet Take 1,000 mg by mouth daily.     aspirin 81 MG tablet Take 81 mg by mouth daily.       atorvastatin (LIPITOR) 40 MG tablet Take 40 mg by mouth daily.       calcium-vitamin D (OSCAL WITH D) 250-125 MG-UNIT per tablet Take 1 tablet by mouth 2 (two) times daily.      chlorthalidone (HYGROTON) 25 MG tablet Take 25 mg by mouth daily.     Collagen Hydrolysate POWD 2 Scoops by Does not apply route daily as needed.     ferrous gluconate (FERGON) 324 MG tablet Take 324 mg by mouth daily with breakfast.     gabapentin (NEURONTIN) 100 MG capsule Take 100 mg by mouth 3 (three) times daily.     isosorbide mononitrate (IMDUR) 30 MG 24 hr tablet Take 30 mg by mouth daily.     losartan (COZAAR) 25 MG tablet Take 0.5 tablets by mouth daily.     magnesium gluconate (MAGONATE) 500 MG tablet Take 500 mg by mouth 2 (two) times daily.     meloxicam (MOBIC) 7.5 MG tablet Take 7.5 mg by mouth daily.     metoprolol succinate (TOPROL-XL) 100 MG 24 hr tablet Take 1 tablet by mouth daily.     Multiple  Vitamins-Minerals (CENTRUM PO) Take 1 capsule by mouth daily.     nitroGLYCERIN (NITROSTAT) 0.6 MG SL tablet Place 0.6 mg under the tongue every 5 (five) minutes as needed.       omeprazole (PRILOSEC) 40 MG capsule Take 1 capsule (40 mg total) by mouth daily. 90 capsule 3   pantoprazole (PROTONIX) 40 MG tablet Take 1  tablet (40 mg total) by mouth 2 (two) times daily before a meal. 180 tablet 3   ranolazine (RANEXA) 500 MG 12 hr tablet Take 500 mg by mouth 2 (two) times daily.     spironolactone (ALDACTONE) 25 MG tablet Take 12.5 mg by mouth daily.     vitamin B-12 (CYANOCOBALAMIN) 1000 MCG tablet Take 1,000 mcg by mouth daily.     zinc gluconate 50 MG tablet Take 50 mg by mouth daily.     No current facility-administered medications for this visit.    Allergies as of 11/13/2022 - Review Complete 08/26/2022  Allergen Reaction Noted   Chocolate flavor Rash 08/17/2013   Cranberry  01/08/2011   Lisinopril Cough 05/27/2016   Niacin      Family History  Problem Relation Age of Onset   Breast cancer Sister    Heart attack Brother        x2    Diabetes Sister    Hypertension Sister    Heart disease Brother    Heart disease Mother    Colon cancer Neg Hx    Colon polyps Neg Hx     Social History   Socioeconomic History   Marital status: Married    Spouse name: Not on file   Number of children: 2   Years of education: Not on file   Highest education level: Not on file  Occupational History   Occupation: retired  Tobacco Use   Smoking status: Former   Smokeless tobacco: Never   Tobacco comments:    quit 1970  Vaping Use   Vaping status: Never Used  Substance and Sexual Activity   Alcohol use: No   Drug use: No   Sexual activity: Not Currently    Partners: Female  Other Topics Concern   Not on file  Social History Narrative   Married, retired   Engineer, 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   Social Determinants of Health   Financial Resource Strain: Low Risk  (08/17/2022)    Received from [facility]   Overall Financial Resource Strain (CARDIA)    Difficulty of Paying Living Expenses: Not hard at all  Food Insecurity: No Food Insecurity (08/17/2022)   [facility]   Hunger Vital Sign    Worried About Running Out of Food in the Last Year: Never true    Ran Out of Food in the Last Year: Never true  Transportation Needs: No Transportation Needs (08/17/2022)   Received from [facility] - Transportation    Lack of Transportation (Medical): No    Lack of Transportation (Non-Medical): No  Physical Activity: Insufficiently Active (08/17/2022)   Received from [facility]   Exercise Vital Sign    Days of Exercise per Week: 3 days    Minutes of Exercise per Session: 20 min  Stress: No Stress Concern Present (08/17/2022)   Received from [facility] - Occupational Stress Questionnaire    Feeling of Stress : Not at all  Social Connections: Unknown (08/20/2022)   Received from [facility]   Social Connection and Isolation Panel [NHANES]    Frequency of Communication with Friends and Family: Not on file    Frequency of Social Gatherings with Friends and Family: Not on file    Attends Religious Services: Not on file    Active Member of Clubs or Organizations: Not on file    Attends Banker Meetings: Not on file    Marital  Status: Widowed  Intimate Partner Violence: Not At Risk (08/20/2022)   Received from [facility]   Humiliation, Afraid, Rape, and Kick questionnaire    Fear of Current or Ex-Partner: No    Emotionally Abused: No    Physically Abused: No    Sexually Abused: No    Review of Systems:    Constitutional: No weight loss, fever or chills Cardiovascular: No chest pain Respiratory: No SOB Gastrointestinal: See HPI and otherwise negative   Physical Exam:  Vital signs: BP 118/68   Pulse 80   Ht 5\' 8"  (1.727 m)   Wt 197 lb (89.4 kg)   BMI 29.95 kg/m     Constitutional:   Pleasant AA male appears to be in NAD, Well developed, Well nourished, alert and cooperative Respiratory: Respirations even and unlabored. Lungs clear to auscultation bilaterally.   No wheezes, crackles, or rhonchi.  Cardiovascular: Normal S1, S2. No MRG. Regular rate and rhythm. No peripheral edema, cyanosis or pallor.  Gastrointestinal:  Soft, nondistended, nontender. No rebound or guarding. Normal bowel sounds. No appreciable masses or hepatomegaly. Rectal:  Not performed.  Psychiatric: Demonstrates good judgement and reason without abnormal affect or behaviors.  RELEVANT LABS AND IMAGING: CBC    Component Value Date/Time   WBC 9.6 02/15/2010 1854   RBC 4.97 02/15/2010 1854   HGB 13.8 02/15/2010 1854   HCT 40.2 02/15/2010 1854   PLT 177 02/15/2010 1854   MCV 80.9 02/15/2010 1854   MCH 27.8 02/15/2010 1854   MCHC 34.3 02/15/2010 1854   RDW 15.4 02/15/2010 1854   LYMPHSABS 1.0 02/15/2010 1854   MONOABS 0.7 02/15/2010 1854   EOSABS 0.1 02/15/2010 1854   BASOSABS 0.0 02/15/2010 1854    CMP     Component Value Date/Time   NA 139 10/08/2020 1034   K 4.3 10/08/2020 1034   CL 104 10/08/2020 1034   CO2 28 10/08/2020 1034   GLUCOSE 105 (H) 10/08/2020 1034   BUN 25 (H) 10/08/2020 1034   CREATININE 0.89 10/08/2020 1034   CALCIUM 9.4 10/08/2020 1034   PROT 7.3 02/15/2010 1854   ALBUMIN 4.0 02/15/2010 1854   AST 54 (H) 02/15/2010 1854   ALT 56 (H) 02/15/2010 1854   ALKPHOS 112 02/15/2010 1854   BILITOT 0.9 02/15/2010 1854   GFRNONAA >60 02/15/2010 1854   GFRAA  02/15/2010 1854    >60        The eGFR has been calculated using the MDRD equation. This calculation has not been validated in all clinical situations. eGFR's persistently <60 mL/min signify possible Chronic Kidney Disease.    Assessment: 1.  GERD/gas/bloating: Controlled on Pantoprazole 40 mg twice a day  Plan: 1.  Continue Pantoprazole 40 mg twice a day.  Patient does not think he needs  refills at this point but they would be good for a year from now. 2.  Patient to follow in clinic with [place] as needed.  Hyacinth Meeker, PA-C [facility] 11/13/2022, 10:51 AM  Cc: Tracey Harries, MD

## 2022-11-13 NOTE — Patient Instructions (Addendum)
_______________________________________________________  If your blood pressure at your visit was 140/90 or greater, please contact your primary care physician to follow up on this.  _______________________________________________________  If you are age 85 or older, your body mass index should be between 23-30. Your Body mass index is 29.95 kg/m. If this is out of the aforementioned range listed, please consider follow up with your Primary Care Provider.  If you are age 29 or younger, your body mass index should be between 19-25. Your Body mass index is 29.95 kg/m. If this is out of the aformentioned range listed, please consider follow up with your Primary Care Provider.   ________________________________________________________  The [facility] providers would like to encourage you to use [facility] to communicate with providers for non-urgent requests or questions.  Due to long hold times on the telephone, sending your provider a message by [facility] may be a faster and more efficient way to get a response.  Please allow 48 business hours for a response.  Please remember that this is for non-urgent requests.  _______________________________________________________  Continue Protonix 2 times a day   Follow up as needed  It was a pleasure to see you today!  Thank you for trusting me with your gastrointestinal care!

## 2023-04-13 IMAGING — CT CT ABD-PELV W/ CM
2 of 6 series · 16 of 46 positions shown, 18 images · IV contrast (Omnipaque)
Comparison: 04/20/2019

CLINICAL DATA: left sided abd pain

EXAM:
CT ABDOMEN AND PELVIS WITH CONTRAST
TECHNIQUE: Multidetector CT imaging of the abdomen and pelvis was performed
using the standard protocol following bolus administration of
intravenous contrast.
CONTRAST:  85mL OMNIPAQUE IOHEXOL 350 MG/ML SOLN

[Series 2: axial st · axial · 0.95mm/px · z∈[-543,-78]mm · 13 of 109 slices shown, 15 images]
[im 8/109  soft-tissue]
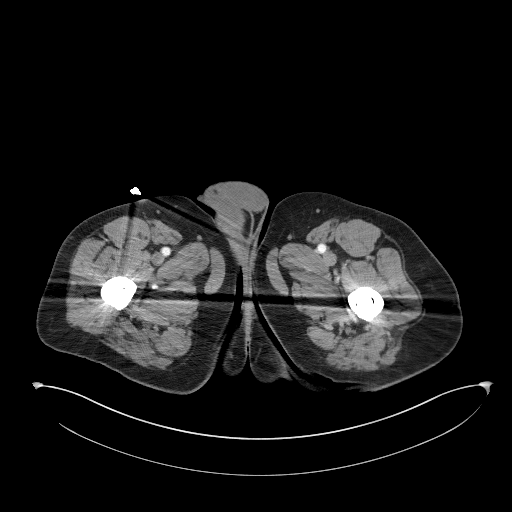
[im 8/109  bone]
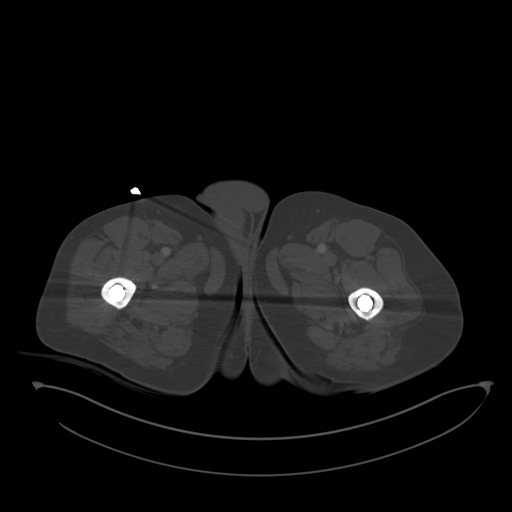
[im 15/109  soft-tissue]
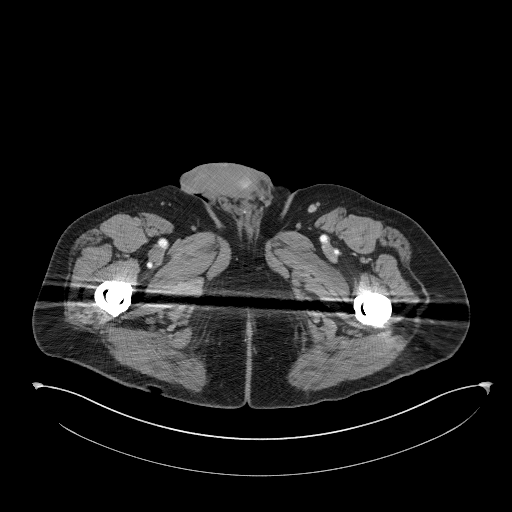
[im 22/109  soft-tissue]
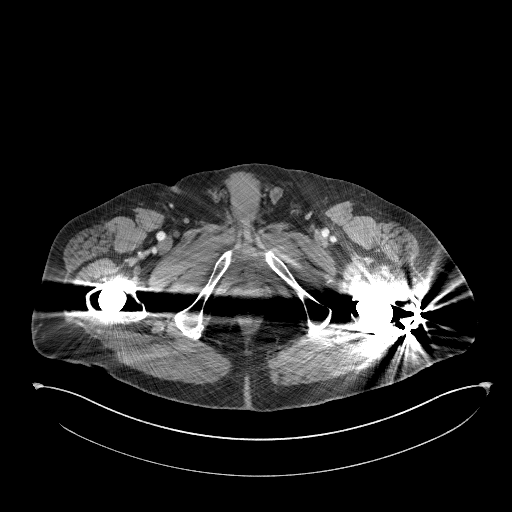
[im 29/109  soft-tissue]
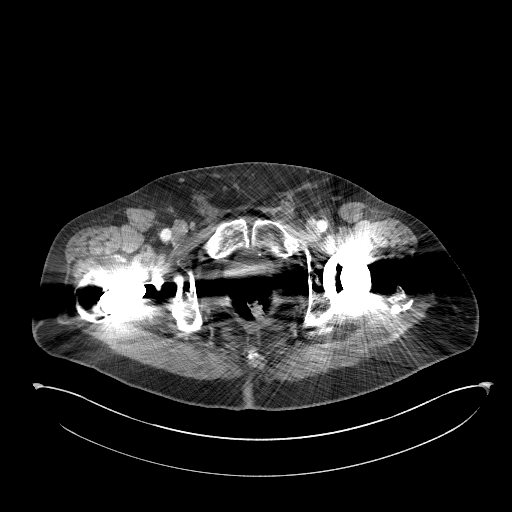
[im 37/109  soft-tissue]
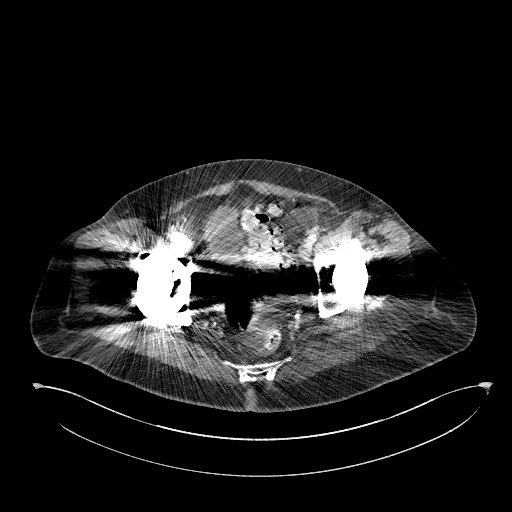
[im 44/109  soft-tissue]
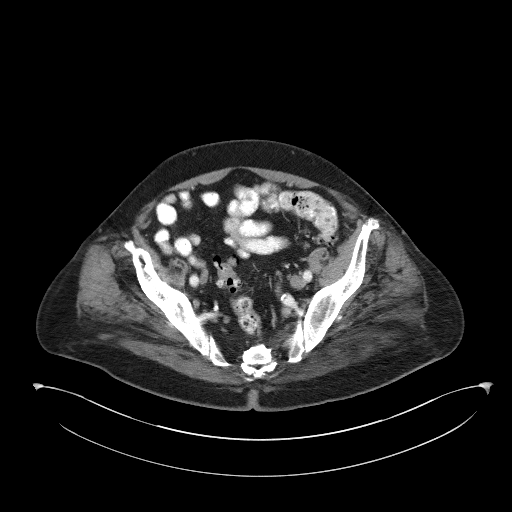
[im 58/109  soft-tissue]
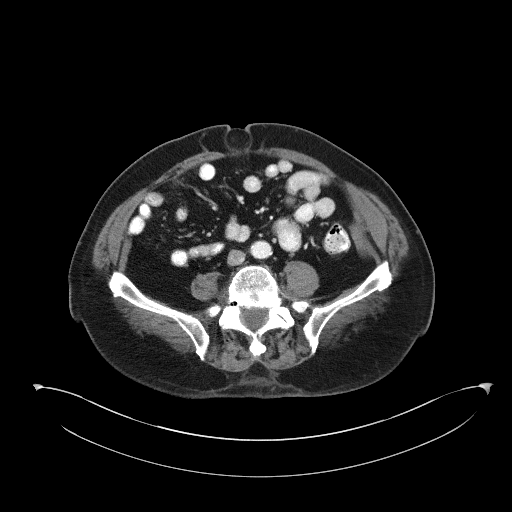
[im 65/109  soft-tissue]
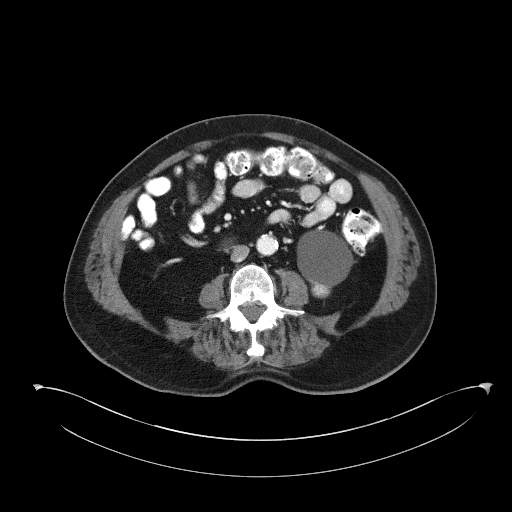
[im 73/109  soft-tissue]
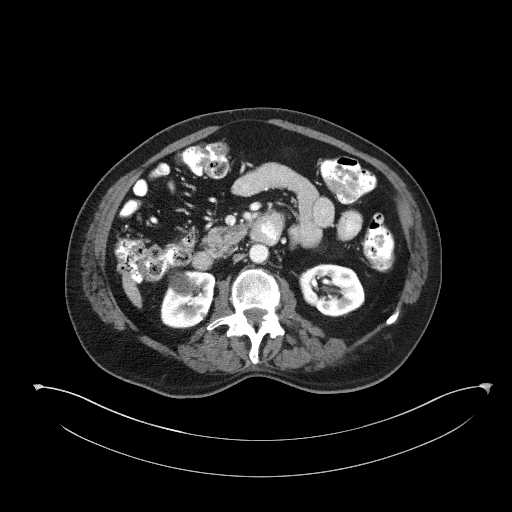
[im 73/109  bone]
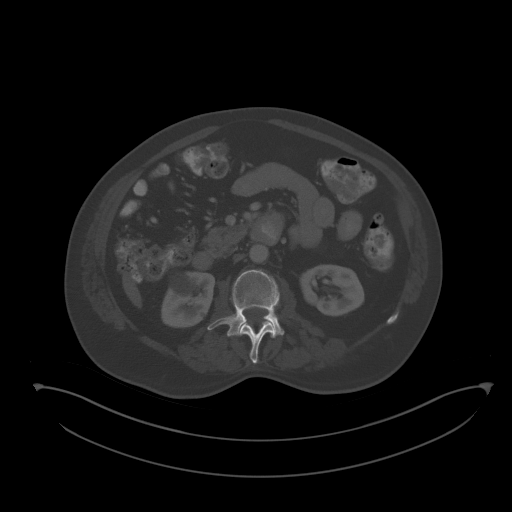
[im 80/109  soft-tissue]
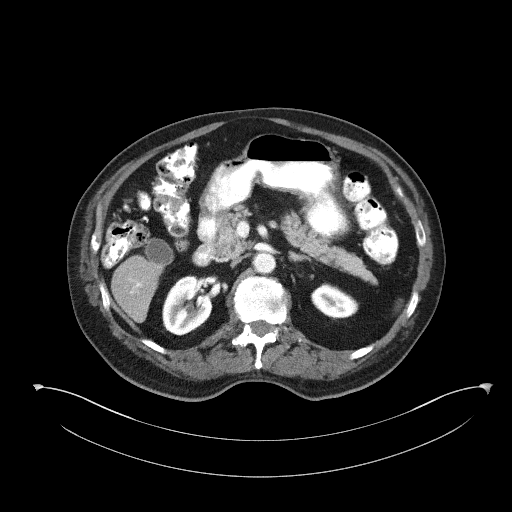
[im 87/109  soft-tissue]
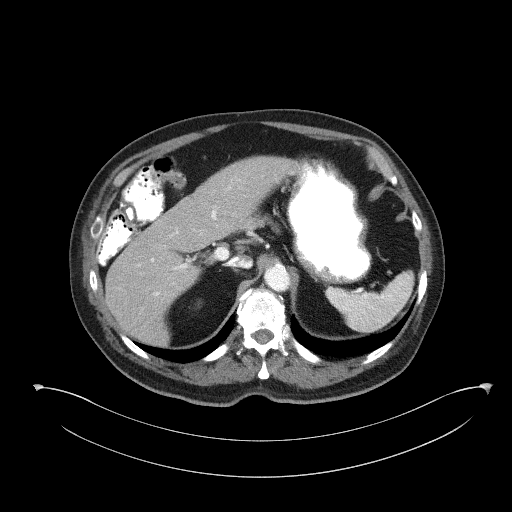
[im 94/109  soft-tissue]
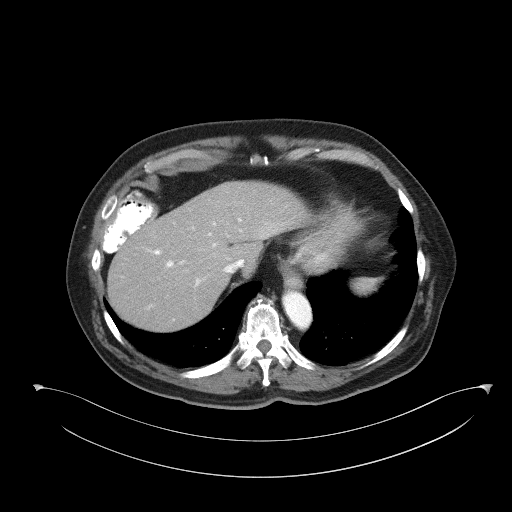
[im 101/109  soft-tissue]
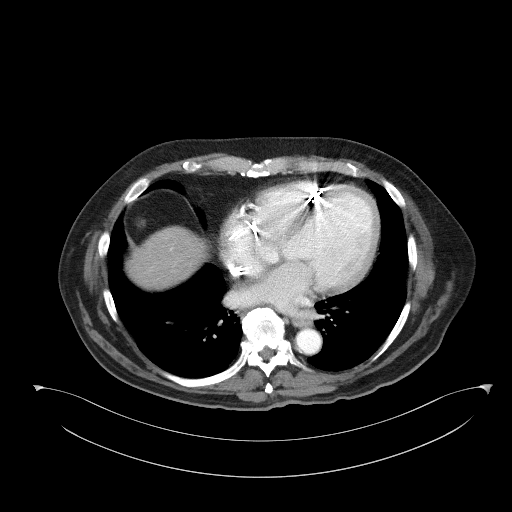

[Series 5: coronal st · coronal · 0.87mm/px · 3 of 98 slices shown]
[im 33/98  soft-tissue]
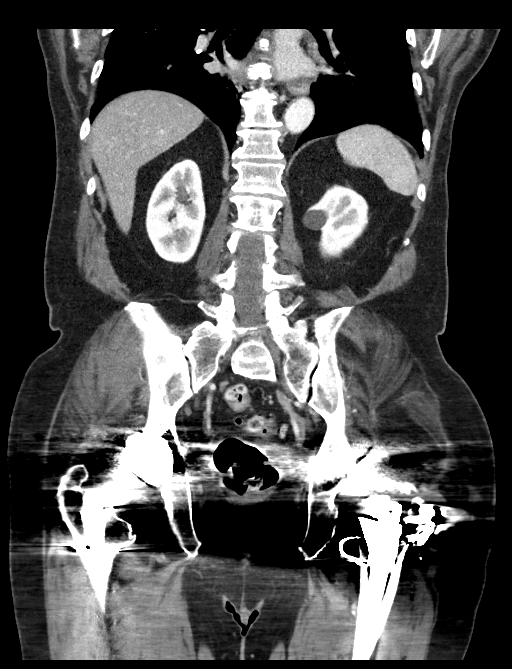
[im 44/98  soft-tissue]
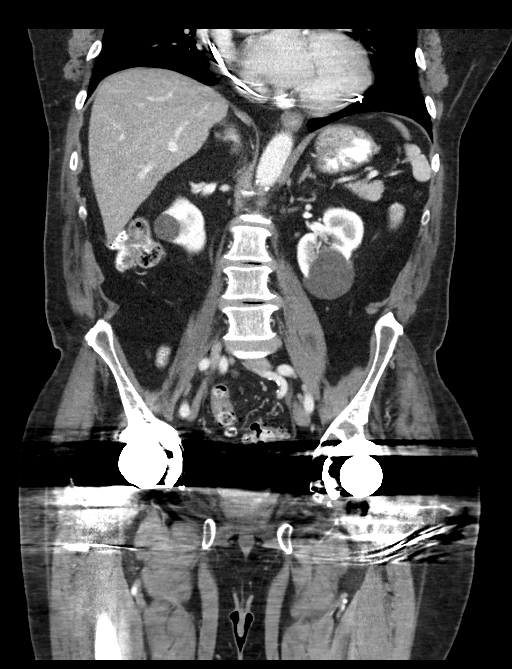
[im 54/98  soft-tissue]
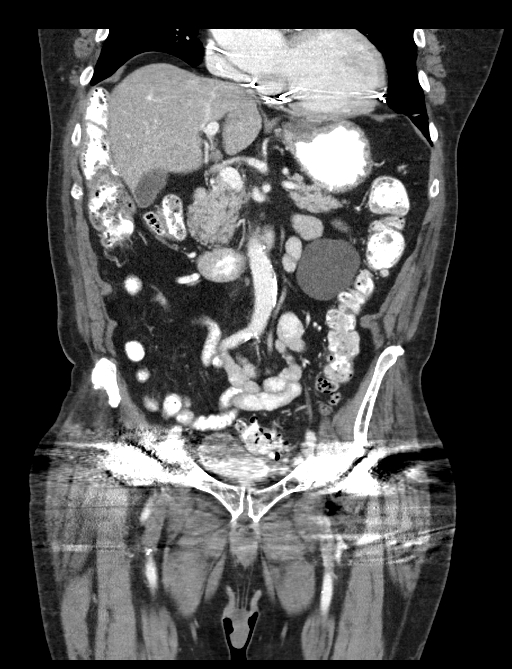

[16 of 46 positions shown; findings below may reference images not displayed]

FINDINGS: Lower chest: No acute abnormality.

Hepatobiliary: No focal liver abnormality is seen. No gallstones,
gallbladder wall thickening, or biliary dilatation.

Pancreas: Unremarkable. No pancreatic ductal dilatation or
surrounding inflammatory changes.

Spleen: Normal in size without focal abnormality.

Adrenals/Urinary Tract: Normal adrenal glands. Bilateral simple
appearing kidney cysts. The largest arises off the inferior pole of
the left kidney measuring 5.8 x 4.7 cm. No kidney stones or
hydronephrosis. Proximal and mid ureters are patent without
hydroureter or ureterolithiasis. Excessive beam hardening artifact
from bilateral hip arthroplasty obscures the distal ureters and much
of the urinary bladder.

Stomach/Bowel: Stomach is within normal limits. Status post
appendectomy. Extensive sigmoid diverticulosis without signs of
acute diverticulitis. No evidence of bowel wall thickening,
distention, or inflammatory changes.

Vascular/Lymphatic: Aortic atherosclerosis. There is no aneurysm. No
abdominopelvic adenopathy

Reproductive: Prostate gland is not visualized due to excessive beam
hardening artifact from patient's bilateral hip arthroplasty.

Other: Signs of previous left inguinal herniorrhaphy. Umbilical
hernia measuring 2.4 cm contains fat only. There is no free fluid or
fluid collection.

Musculoskeletal: Status post bilateral total hip arthroplasty. No
acute or suspicious bone lesions. Multilevel lumbar degenerative
disc disease is most advanced at L5-S1.
IMPRESSION: 1. No acute findings identified within the abdomen or pelvis.
2. Extensive sigmoid diverticulosis without signs of acute
diverticulitis.
3. Bilateral kidney cysts.
4. Umbilical hernia contains fat only.
5. Aortic Atherosclerosis ([id number]).

## 2023-08-23 ENCOUNTER — Other Ambulatory Visit: Payer: Self-pay | Admitting: Physician Assistant
